# Patient Record
Sex: Male | Born: 1935 | Race: Black or African American | Hispanic: No | Marital: Married | Smoking: Former smoker
Health system: Southern US, Community
[De-identification: ages and names within clinical notes are randomized; demographics above are authoritative.]

## PROBLEM LIST (undated history)

## (undated) DIAGNOSIS — I669 Occlusion and stenosis of unspecified cerebral artery: Secondary | ICD-10-CM

## (undated) DIAGNOSIS — I5022 Chronic systolic (congestive) heart failure: Secondary | ICD-10-CM

## (undated) DIAGNOSIS — M109 Gout, unspecified: Secondary | ICD-10-CM

## (undated) DIAGNOSIS — I480 Paroxysmal atrial fibrillation: Secondary | ICD-10-CM

## (undated) DIAGNOSIS — I639 Cerebral infarction, unspecified: Secondary | ICD-10-CM

## (undated) DIAGNOSIS — E785 Hyperlipidemia, unspecified: Secondary | ICD-10-CM

## (undated) DIAGNOSIS — I2510 Atherosclerotic heart disease of native coronary artery without angina pectoris: Secondary | ICD-10-CM

## (undated) DIAGNOSIS — I10 Essential (primary) hypertension: Secondary | ICD-10-CM

## (undated) HISTORY — PX: CHOLECYSTECTOMY: SHX55

## (undated) HISTORY — PX: CORONARY ARTERY BYPASS GRAFT: SHX141

---

## 1999-07-12 ENCOUNTER — Encounter: Payer: Self-pay | Admitting: Surgery

## 1999-07-12 ENCOUNTER — Inpatient Hospital Stay (HOSPITAL_COMMUNITY): Admission: EM | Admit: 1999-07-12 | Discharge: 1999-07-16 | Payer: Self-pay | Admitting: Emergency Medicine

## 1999-07-12 ENCOUNTER — Encounter: Payer: Self-pay | Admitting: Emergency Medicine

## 1999-07-13 ENCOUNTER — Encounter: Payer: Self-pay | Admitting: Surgery

## 1999-07-17 ENCOUNTER — Encounter (INDEPENDENT_AMBULATORY_CARE_PROVIDER_SITE_OTHER): Payer: Self-pay | Admitting: Specialist

## 1999-07-17 ENCOUNTER — Encounter: Payer: Self-pay | Admitting: Surgery

## 1999-07-17 ENCOUNTER — Observation Stay (HOSPITAL_COMMUNITY): Admission: RE | Admit: 1999-07-17 | Discharge: 1999-07-18 | Payer: Self-pay | Admitting: Surgery

## 2000-06-08 ENCOUNTER — Encounter: Admission: RE | Admit: 2000-06-08 | Discharge: 2000-09-06 | Payer: Self-pay | Admitting: Family Medicine

## 2003-04-05 ENCOUNTER — Emergency Department (HOSPITAL_COMMUNITY): Admission: EM | Admit: 2003-04-05 | Discharge: 2003-04-05 | Payer: Self-pay | Admitting: Emergency Medicine

## 2004-01-19 ENCOUNTER — Inpatient Hospital Stay (HOSPITAL_COMMUNITY): Admission: EM | Admit: 2004-01-19 | Discharge: 2004-01-21 | Payer: Self-pay | Admitting: Emergency Medicine

## 2004-01-20 ENCOUNTER — Encounter (INDEPENDENT_AMBULATORY_CARE_PROVIDER_SITE_OTHER): Payer: Self-pay | Admitting: Interventional Cardiology

## 2004-04-01 ENCOUNTER — Inpatient Hospital Stay (HOSPITAL_COMMUNITY): Admission: EM | Admit: 2004-04-01 | Discharge: 2004-04-04 | Payer: Self-pay | Admitting: Emergency Medicine

## 2005-02-25 ENCOUNTER — Emergency Department (HOSPITAL_COMMUNITY): Admission: EM | Admit: 2005-02-25 | Discharge: 2005-02-25 | Payer: Self-pay | Admitting: Emergency Medicine

## 2007-07-27 ENCOUNTER — Inpatient Hospital Stay (HOSPITAL_COMMUNITY): Admission: EM | Admit: 2007-07-27 | Discharge: 2007-08-04 | Payer: Self-pay | Admitting: Emergency Medicine

## 2007-07-28 ENCOUNTER — Ambulatory Visit: Payer: Self-pay | Admitting: Physical Medicine & Rehabilitation

## 2007-07-28 ENCOUNTER — Encounter (INDEPENDENT_AMBULATORY_CARE_PROVIDER_SITE_OTHER): Payer: Self-pay | Admitting: Interventional Cardiology

## 2007-10-12 ENCOUNTER — Observation Stay (HOSPITAL_COMMUNITY): Admission: EM | Admit: 2007-10-12 | Discharge: 2007-10-13 | Payer: Self-pay | Admitting: Emergency Medicine

## 2008-05-23 ENCOUNTER — Inpatient Hospital Stay (HOSPITAL_COMMUNITY): Admission: EM | Admit: 2008-05-23 | Discharge: 2008-05-28 | Payer: Self-pay | Admitting: Emergency Medicine

## 2008-05-23 ENCOUNTER — Ambulatory Visit: Payer: Self-pay | Admitting: Pulmonary Disease

## 2008-05-23 ENCOUNTER — Encounter: Payer: Self-pay | Admitting: Pulmonary Disease

## 2008-05-23 ENCOUNTER — Ambulatory Visit: Payer: Self-pay | Admitting: Vascular Surgery

## 2008-06-07 ENCOUNTER — Ambulatory Visit: Payer: Self-pay | Admitting: Internal Medicine

## 2008-06-07 ENCOUNTER — Inpatient Hospital Stay (HOSPITAL_COMMUNITY): Admission: EM | Admit: 2008-06-07 | Discharge: 2008-06-13 | Payer: Self-pay | Admitting: Emergency Medicine

## 2008-06-17 ENCOUNTER — Emergency Department (HOSPITAL_COMMUNITY): Admission: EM | Admit: 2008-06-17 | Discharge: 2008-06-18 | Payer: Self-pay | Admitting: Emergency Medicine

## 2008-12-09 ENCOUNTER — Encounter: Admission: RE | Admit: 2008-12-09 | Discharge: 2008-12-09 | Payer: Self-pay | Admitting: Family Medicine

## 2008-12-19 ENCOUNTER — Inpatient Hospital Stay (HOSPITAL_COMMUNITY): Admission: EM | Admit: 2008-12-19 | Discharge: 2008-12-26 | Payer: Self-pay | Admitting: Emergency Medicine

## 2009-01-10 ENCOUNTER — Encounter: Admission: RE | Admit: 2009-01-10 | Discharge: 2009-01-10 | Payer: Self-pay | Admitting: Family Medicine

## 2009-10-14 ENCOUNTER — Emergency Department (HOSPITAL_COMMUNITY): Admission: EM | Admit: 2009-10-14 | Discharge: 2009-10-14 | Payer: Self-pay | Admitting: Emergency Medicine

## 2009-11-15 IMAGING — CR DG CHEST 1V PORT
1 series · 1 of 1 positions shown · non-contrast
Comparison: [DATE]

CLINICAL DATA: Cough

PORTABLE CHEST - 1 VIEW

[AP]
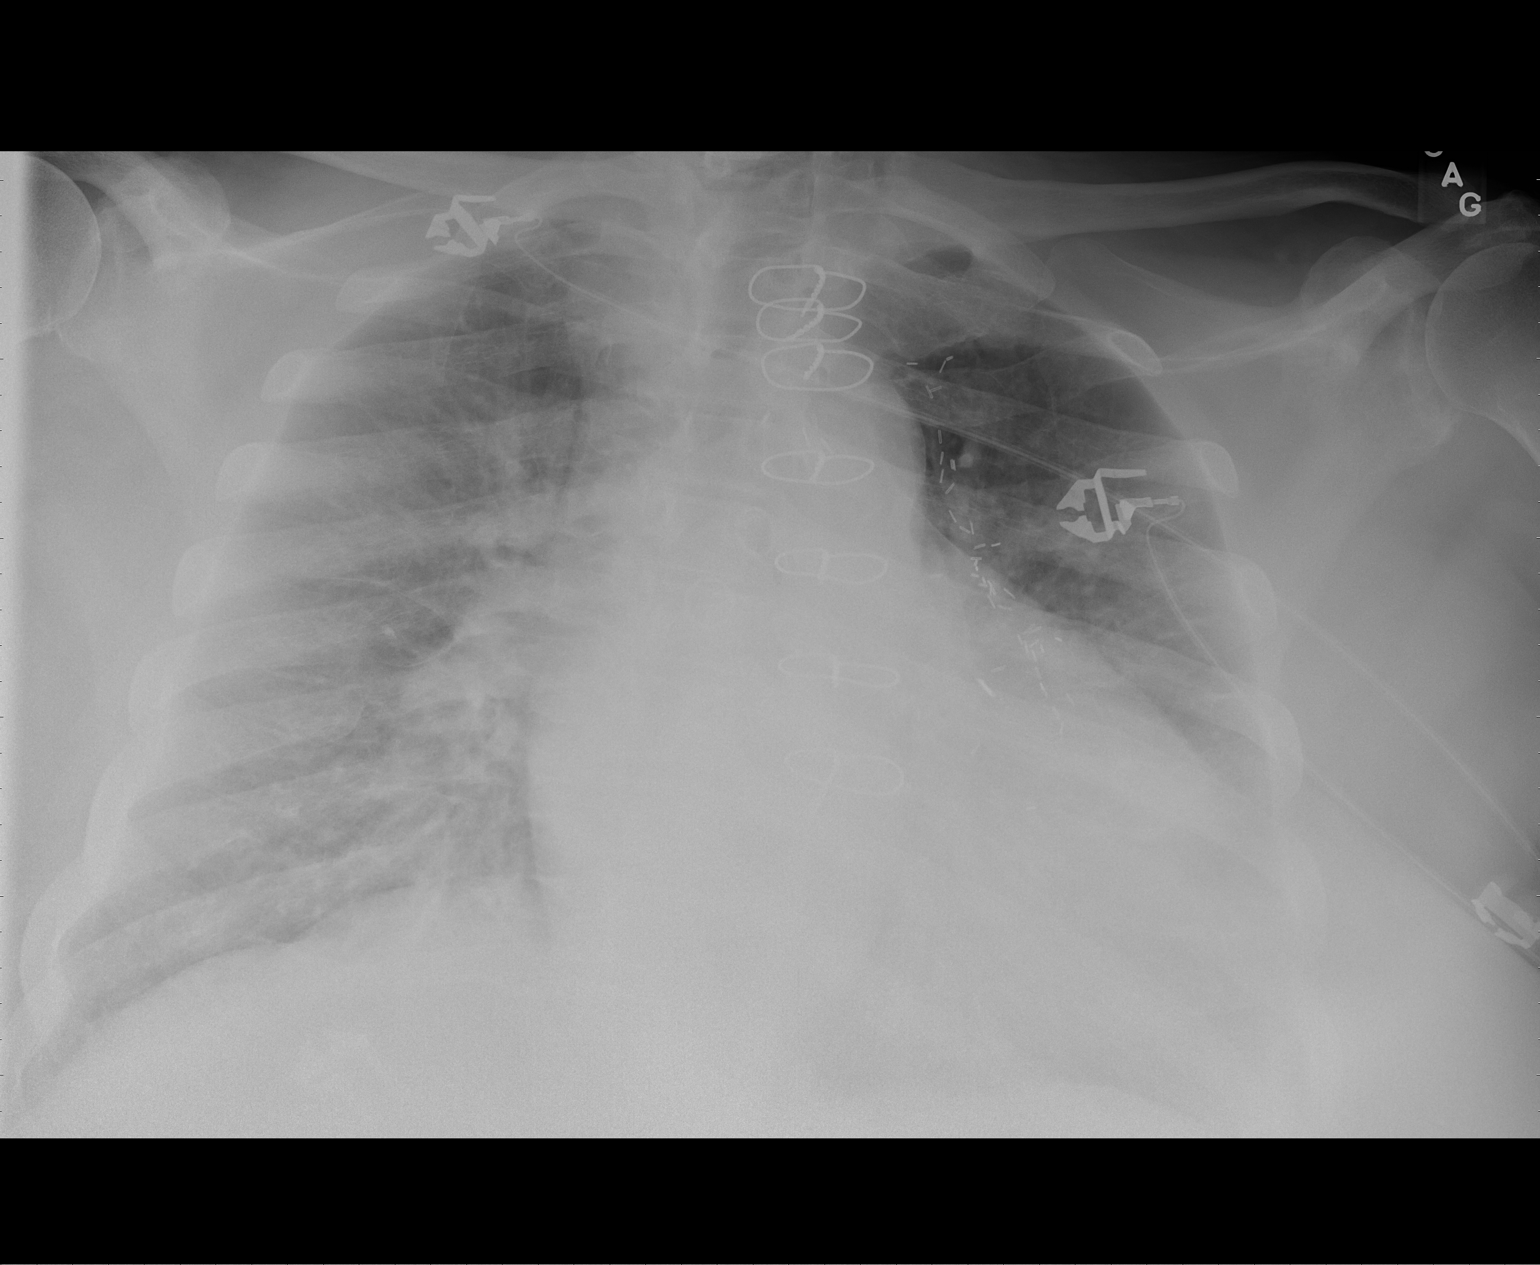

[1 of 1 positions shown; findings below may reference images not displayed]

FINDINGS: There has been previous median sternotomy and CABG.
There is worsening of pulmonary density that could reflect mild
interstitial edema.  No consolidation, collapse or definable
effusion.
IMPRESSION: Increase in interstitial density that could reflect early
interstitial edema.

## 2009-11-19 ENCOUNTER — Emergency Department (HOSPITAL_COMMUNITY): Admission: EM | Admit: 2009-11-19 | Discharge: 2009-11-19 | Payer: Self-pay | Admitting: Emergency Medicine

## 2010-01-04 ENCOUNTER — Emergency Department (HOSPITAL_COMMUNITY): Admission: EM | Admit: 2010-01-04 | Discharge: 2010-01-04 | Payer: Self-pay | Admitting: Emergency Medicine

## 2010-05-12 LAB — DIFFERENTIAL
Basophils Relative: 0 % (ref 0–1)
Lymphocytes Relative: 10 % — ABNORMAL LOW (ref 12–46)
Lymphs Abs: 0.8 10*3/uL (ref 0.7–4.0)
Monocytes Relative: 9 % (ref 3–12)
Neutro Abs: 6.4 10*3/uL (ref 1.7–7.7)
Neutrophils Relative %: 80 % — ABNORMAL HIGH (ref 43–77)

## 2010-05-12 LAB — BASIC METABOLIC PANEL
Calcium: 9.3 mg/dL (ref 8.4–10.5)
GFR calc Af Amer: 57 mL/min — ABNORMAL LOW (ref >60)
GFR calc non Af Amer: 47 mL/min — ABNORMAL LOW (ref >60)
Potassium: 3.8 mEq/L (ref 3.5–5.1)
Sodium: 137 mEq/L (ref 135–145)

## 2010-05-12 LAB — CBC
Hemoglobin: 13.9 g/dL (ref 13.0–17.0)
RBC: 4.84 MIL/uL (ref 4.22–5.81)
WBC: 8 10*3/uL (ref 4.0–10.5)

## 2010-05-14 LAB — CBC
HCT: 41.5 % (ref 39.0–52.0)
Hemoglobin: 13.1 g/dL (ref 13.0–17.0)
Hemoglobin: 12.9 g/dL — ABNORMAL LOW (ref 13.0–17.0)
MCH: 27.8 pg (ref 26.0–34.0)
MCH: 27.6 pg (ref 26.0–34.0)
RBC: 4.72 MIL/uL (ref 4.22–5.81)
RBC: 4.67 MIL/uL (ref 4.22–5.81)

## 2010-05-14 LAB — BASIC METABOLIC PANEL
CO2: 28 mEq/L (ref 19–32)
Calcium: 9 mg/dL (ref 8.4–10.5)
GFR calc Af Amer: 55 mL/min — ABNORMAL LOW (ref >60)
Potassium: 4 mEq/L (ref 3.5–5.1)
Sodium: 137 mEq/L (ref 135–145)

## 2010-05-14 LAB — URIC ACID
Uric Acid, Serum: 6 mg/dL (ref 4.0–7.8)
Uric Acid, Serum: 6.2 mg/dL (ref 4.0–7.8)

## 2010-05-14 LAB — DIFFERENTIAL
Lymphs Abs: 0.5 10*3/uL — ABNORMAL LOW (ref 0.7–4.0)
Monocytes Relative: 8 % (ref 3–12)
Neutro Abs: 7 10*3/uL (ref 1.7–7.7)
Neutrophils Relative %: 85 % — ABNORMAL HIGH (ref 43–77)

## 2010-05-14 LAB — PROTIME-INR: INR: 2.23 — ABNORMAL HIGH (ref 0.00–1.49)

## 2010-05-14 LAB — POCT I-STAT, CHEM 8
Chloride: 108 mEq/L (ref 96–112)
HCT: 44 % (ref 39.0–52.0)
Potassium: 3.9 mEq/L (ref 3.5–5.1)

## 2010-05-14 LAB — BRAIN NATRIURETIC PEPTIDE: Pro B Natriuretic peptide (BNP): 1046 pg/mL — ABNORMAL HIGH (ref 0.0–100.0)

## 2010-06-04 LAB — GLUCOSE, CAPILLARY
Glucose-Capillary: 105 mg/dL — ABNORMAL HIGH (ref 70–99)
Glucose-Capillary: 110 mg/dL — ABNORMAL HIGH (ref 70–99)
Glucose-Capillary: 116 mg/dL — ABNORMAL HIGH (ref 70–99)
Glucose-Capillary: 120 mg/dL — ABNORMAL HIGH (ref 70–99)
Glucose-Capillary: 127 mg/dL — ABNORMAL HIGH (ref 70–99)
Glucose-Capillary: 127 mg/dL — ABNORMAL HIGH (ref 70–99)
Glucose-Capillary: 128 mg/dL — ABNORMAL HIGH (ref 70–99)
Glucose-Capillary: 135 mg/dL — ABNORMAL HIGH (ref 70–99)
Glucose-Capillary: 162 mg/dL — ABNORMAL HIGH (ref 70–99)
Glucose-Capillary: 55 mg/dL — ABNORMAL LOW (ref 70–99)
Glucose-Capillary: 58 mg/dL — ABNORMAL LOW (ref 70–99)
Glucose-Capillary: 61 mg/dL — ABNORMAL LOW (ref 70–99)
Glucose-Capillary: 63 mg/dL — ABNORMAL LOW (ref 70–99)
Glucose-Capillary: 66 mg/dL — ABNORMAL LOW (ref 70–99)
Glucose-Capillary: 72 mg/dL (ref 70–99)
Glucose-Capillary: 73 mg/dL (ref 70–99)
Glucose-Capillary: 75 mg/dL (ref 70–99)
Glucose-Capillary: 78 mg/dL (ref 70–99)
Glucose-Capillary: 82 mg/dL (ref 70–99)
Glucose-Capillary: 85 mg/dL (ref 70–99)
Glucose-Capillary: 87 mg/dL (ref 70–99)
Glucose-Capillary: 89 mg/dL (ref 70–99)
Glucose-Capillary: 89 mg/dL (ref 70–99)
Glucose-Capillary: 95 mg/dL (ref 70–99)
Glucose-Capillary: 98 mg/dL (ref 70–99)

## 2010-06-04 LAB — BASIC METABOLIC PANEL
BUN: 27 mg/dL — ABNORMAL HIGH (ref 6–23)
BUN: 50 mg/dL — ABNORMAL HIGH (ref 6–23)
BUN: 60 mg/dL — ABNORMAL HIGH (ref 6–23)
BUN: 66 mg/dL — ABNORMAL HIGH (ref 6–23)
CO2: 19 mEq/L (ref 19–32)
CO2: 21 mEq/L (ref 19–32)
CO2: 24 mEq/L (ref 19–32)
CO2: 25 mEq/L (ref 19–32)
Calcium: 8.6 mg/dL (ref 8.4–10.5)
Calcium: 9 mg/dL (ref 8.4–10.5)
Calcium: 9.1 mg/dL (ref 8.4–10.5)
Calcium: 9.1 mg/dL (ref 8.4–10.5)
Chloride: 100 mEq/L (ref 96–112)
Chloride: 104 mEq/L (ref 96–112)
Chloride: 105 mEq/L (ref 96–112)
Creatinine, Ser: 1.75 mg/dL — ABNORMAL HIGH (ref 0.4–1.5)
Creatinine, Ser: 1.85 mg/dL — ABNORMAL HIGH (ref 0.4–1.5)
Creatinine, Ser: 1.91 mg/dL — ABNORMAL HIGH (ref 0.4–1.5)
Creatinine, Ser: 2.07 mg/dL — ABNORMAL HIGH (ref 0.4–1.5)
Creatinine, Ser: 2.67 mg/dL — ABNORMAL HIGH (ref 0.4–1.5)
Creatinine, Ser: 2.83 mg/dL — ABNORMAL HIGH (ref 0.4–1.5)
GFR calc Af Amer: 28 mL/min — ABNORMAL LOW (ref >60)
GFR calc Af Amer: 29 mL/min — ABNORMAL LOW (ref >60)
GFR calc Af Amer: 33 mL/min — ABNORMAL LOW (ref >60)
GFR calc Af Amer: 42 mL/min — ABNORMAL LOW (ref >60)
GFR calc non Af Amer: 23 mL/min — ABNORMAL LOW (ref >60)
GFR calc non Af Amer: 24 mL/min — ABNORMAL LOW (ref >60)
GFR calc non Af Amer: 28 mL/min — ABNORMAL LOW (ref >60)
GFR calc non Af Amer: 35 mL/min — ABNORMAL LOW (ref >60)
GFR calc non Af Amer: 36 mL/min — ABNORMAL LOW (ref >60)
GFR calc non Af Amer: 39 mL/min — ABNORMAL LOW (ref >60)
Glucose, Bld: 110 mg/dL — ABNORMAL HIGH (ref 70–99)
Glucose, Bld: 66 mg/dL — ABNORMAL LOW (ref 70–99)
Glucose, Bld: 77 mg/dL (ref 70–99)
Glucose, Bld: 94 mg/dL (ref 70–99)
Potassium: 3.9 mEq/L (ref 3.5–5.1)
Potassium: 4.5 mEq/L (ref 3.5–5.1)
Potassium: 4.7 mEq/L (ref 3.5–5.1)
Potassium: 5.9 mEq/L — ABNORMAL HIGH (ref 3.5–5.1)
Sodium: 133 mEq/L — ABNORMAL LOW (ref 135–145)
Sodium: 134 mEq/L — ABNORMAL LOW (ref 135–145)
Sodium: 135 mEq/L (ref 135–145)
Sodium: 136 mEq/L (ref 135–145)

## 2010-06-04 LAB — CBC
HCT: 37.2 % — ABNORMAL LOW (ref 39.0–52.0)
HCT: 38.5 % — ABNORMAL LOW (ref 39.0–52.0)
HCT: 39.7 % (ref 39.0–52.0)
HCT: 42.4 % (ref 39.0–52.0)
Hemoglobin: 12.4 g/dL — ABNORMAL LOW (ref 13.0–17.0)
Hemoglobin: 13 g/dL (ref 13.0–17.0)
MCHC: 32.6 g/dL (ref 30.0–36.0)
MCHC: 32.8 g/dL (ref 30.0–36.0)
MCHC: 32.9 g/dL (ref 30.0–36.0)
MCV: 88.9 fL (ref 78.0–100.0)
MCV: 89.1 fL (ref 78.0–100.0)
Platelets: 151 10*3/uL (ref 150–400)
Platelets: 152 10*3/uL (ref 150–400)
RBC: 4.49 MIL/uL (ref 4.22–5.81)
RBC: 4.77 MIL/uL (ref 4.22–5.81)
RDW: 20.1 % — ABNORMAL HIGH (ref 11.5–15.5)
WBC: 6.3 10*3/uL (ref 4.0–10.5)
WBC: 6.5 10*3/uL (ref 4.0–10.5)
WBC: 8.3 10*3/uL (ref 4.0–10.5)

## 2010-06-04 LAB — POCT CARDIAC MARKERS
CKMB, poc: 1.5 ng/mL (ref 1.0–8.0)
Troponin i, poc: <0.05 ng/mL (ref 0.00–0.09)

## 2010-06-04 LAB — POCT I-STAT, CHEM 8
BUN: 33 mg/dL — ABNORMAL HIGH (ref 6–23)
Calcium, Ion: 1.21 mmol/L (ref 1.12–1.32)
Chloride: 106 mEq/L (ref 96–112)
Glucose, Bld: 147 mg/dL — ABNORMAL HIGH (ref 70–99)

## 2010-06-04 LAB — DIFFERENTIAL
Eosinophils Relative: 1 % (ref 0–5)
Lymphocytes Relative: 5 % — ABNORMAL LOW (ref 12–46)
Lymphs Abs: 0.3 10*3/uL — ABNORMAL LOW (ref 0.7–4.0)
Monocytes Absolute: 0.3 10*3/uL (ref 0.1–1.0)

## 2010-06-04 LAB — PROTIME-INR
INR: 2.74 — ABNORMAL HIGH (ref 0.00–1.49)
INR: 2.84 — ABNORMAL HIGH (ref 0.00–1.49)
INR: 3.58 — ABNORMAL HIGH (ref 0.00–1.49)
Prothrombin Time: 28.8 seconds — ABNORMAL HIGH (ref 11.6–15.2)
Prothrombin Time: 29.1 seconds — ABNORMAL HIGH (ref 11.6–15.2)
Prothrombin Time: 32.8 seconds — ABNORMAL HIGH (ref 11.6–15.2)
Prothrombin Time: 34.7 seconds — ABNORMAL HIGH (ref 11.6–15.2)
Prothrombin Time: 35.5 seconds — ABNORMAL HIGH (ref 11.6–15.2)
Prothrombin Time: 38.2 seconds — ABNORMAL HIGH (ref 11.6–15.2)

## 2010-06-04 LAB — URINALYSIS, ROUTINE W REFLEX MICROSCOPIC
Bilirubin Urine: NEGATIVE
Glucose, UA: NEGATIVE mg/dL
Hgb urine dipstick: NEGATIVE
Ketones, ur: NEGATIVE mg/dL
Protein, ur: NEGATIVE mg/dL

## 2010-06-04 LAB — HEMOGLOBIN A1C: Mean Plasma Glucose: 126 mg/dL

## 2010-06-04 LAB — BRAIN NATRIURETIC PEPTIDE
Pro B Natriuretic peptide (BNP): 1205 pg/mL — ABNORMAL HIGH (ref 0.0–100.0)
Pro B Natriuretic peptide (BNP): 907 pg/mL — ABNORMAL HIGH (ref 0.0–100.0)

## 2010-06-10 LAB — URINALYSIS, ROUTINE W REFLEX MICROSCOPIC
Bilirubin Urine: NEGATIVE
Glucose, UA: NEGATIVE mg/dL
Hgb urine dipstick: NEGATIVE
Ketones, ur: 15 mg/dL — AB
Nitrite: NEGATIVE
Specific Gravity, Urine: 1.008 (ref 1.005–1.030)
Specific Gravity, Urine: 1.036 — ABNORMAL HIGH (ref 1.005–1.030)
Urobilinogen, UA: 1 mg/dL (ref 0.0–1.0)
pH: 5 (ref 5.0–8.0)
pH: 7.5 (ref 5.0–8.0)

## 2010-06-10 LAB — BASIC METABOLIC PANEL
BUN: 14 mg/dL (ref 6–23)
BUN: 16 mg/dL (ref 6–23)
BUN: 18 mg/dL (ref 6–23)
BUN: 19 mg/dL (ref 6–23)
BUN: 8 mg/dL (ref 6–23)
CO2: 16 mEq/L — ABNORMAL LOW (ref 19–32)
CO2: 24 mEq/L (ref 19–32)
CO2: 27 mEq/L (ref 19–32)
Calcium: 8.6 mg/dL (ref 8.4–10.5)
Calcium: 9 mg/dL (ref 8.4–10.5)
Chloride: 104 mEq/L (ref 96–112)
Chloride: 106 mEq/L (ref 96–112)
Chloride: 108 mEq/L (ref 96–112)
Creatinine, Ser: 1.34 mg/dL (ref 0.4–1.5)
Creatinine, Ser: 1.78 mg/dL — ABNORMAL HIGH (ref 0.4–1.5)
GFR calc Af Amer: 46 mL/min — ABNORMAL LOW (ref >60)
GFR calc Af Amer: >60 mL/min (ref >60)
GFR calc Af Amer: >60 mL/min (ref >60)
GFR calc non Af Amer: 41 mL/min — ABNORMAL LOW (ref >60)
GFR calc non Af Amer: 50 mL/min — ABNORMAL LOW (ref >60)
GFR calc non Af Amer: 53 mL/min — ABNORMAL LOW (ref >60)
GFR calc non Af Amer: 56 mL/min — ABNORMAL LOW (ref >60)
Glucose, Bld: 334 mg/dL — ABNORMAL HIGH (ref 70–99)
Glucose, Bld: 91 mg/dL (ref 70–99)
Potassium: 3.9 mEq/L (ref 3.5–5.1)
Potassium: 4.5 mEq/L (ref 3.5–5.1)
Potassium: 5.3 mEq/L — ABNORMAL HIGH (ref 3.5–5.1)
Sodium: 129 mEq/L — ABNORMAL LOW (ref 135–145)
Sodium: 135 mEq/L (ref 135–145)
Sodium: 139 mEq/L (ref 135–145)

## 2010-06-10 LAB — DIFFERENTIAL
Basophils Absolute: 0 10*3/uL (ref 0.0–0.1)
Basophils Absolute: 0 10*3/uL (ref 0.0–0.1)
Eosinophils Absolute: 0.1 10*3/uL (ref 0.0–0.7)
Eosinophils Absolute: 0.3 10*3/uL (ref 0.0–0.7)
Eosinophils Relative: 1 % (ref 0–5)
Eosinophils Relative: 4 % (ref 0–5)
Lymphocytes Relative: 12 % (ref 12–46)
Lymphocytes Relative: 9 % — ABNORMAL LOW (ref 12–46)
Lymphocytes Relative: 9 % — ABNORMAL LOW (ref 12–46)
Lymphs Abs: 0.8 10*3/uL (ref 0.7–4.0)
Monocytes Absolute: 0.2 10*3/uL (ref 0.1–1.0)
Monocytes Absolute: 0.7 10*3/uL (ref 0.1–1.0)
Monocytes Relative: 9 % (ref 3–12)
Neutro Abs: 4.6 10*3/uL (ref 1.7–7.7)
Neutrophils Relative %: 74 % (ref 43–77)

## 2010-06-10 LAB — GLUCOSE, CAPILLARY
Glucose-Capillary: 100 mg/dL — ABNORMAL HIGH (ref 70–99)
Glucose-Capillary: 108 mg/dL — ABNORMAL HIGH (ref 70–99)
Glucose-Capillary: 113 mg/dL — ABNORMAL HIGH (ref 70–99)
Glucose-Capillary: 119 mg/dL — ABNORMAL HIGH (ref 70–99)
Glucose-Capillary: 128 mg/dL — ABNORMAL HIGH (ref 70–99)
Glucose-Capillary: 131 mg/dL — ABNORMAL HIGH (ref 70–99)
Glucose-Capillary: 136 mg/dL — ABNORMAL HIGH (ref 70–99)
Glucose-Capillary: 137 mg/dL — ABNORMAL HIGH (ref 70–99)
Glucose-Capillary: 169 mg/dL — ABNORMAL HIGH (ref 70–99)
Glucose-Capillary: 60 mg/dL — ABNORMAL LOW (ref 70–99)
Glucose-Capillary: 62 mg/dL — ABNORMAL LOW (ref 70–99)
Glucose-Capillary: 68 mg/dL — ABNORMAL LOW (ref 70–99)
Glucose-Capillary: 71 mg/dL (ref 70–99)
Glucose-Capillary: 78 mg/dL (ref 70–99)
Glucose-Capillary: 81 mg/dL (ref 70–99)
Glucose-Capillary: 89 mg/dL (ref 70–99)
Glucose-Capillary: 91 mg/dL (ref 70–99)

## 2010-06-10 LAB — STREP PNEUMONIAE URINARY ANTIGEN: Strep Pneumo Urinary Antigen: NEGATIVE

## 2010-06-10 LAB — CBC
HCT: 35.2 % — ABNORMAL LOW (ref 39.0–52.0)
HCT: 36.8 % — ABNORMAL LOW (ref 39.0–52.0)
HCT: 39.9 % (ref 39.0–52.0)
HCT: 40 % (ref 39.0–52.0)
Hemoglobin: 11.5 g/dL — ABNORMAL LOW (ref 13.0–17.0)
Hemoglobin: 11.6 g/dL — ABNORMAL LOW (ref 13.0–17.0)
Hemoglobin: 12.2 g/dL — ABNORMAL LOW (ref 13.0–17.0)
Hemoglobin: 13 g/dL (ref 13.0–17.0)
Hemoglobin: 13.4 g/dL (ref 13.0–17.0)
MCHC: 32.5 g/dL (ref 30.0–36.0)
MCHC: 33.2 g/dL (ref 30.0–36.0)
MCHC: 33.3 g/dL (ref 30.0–36.0)
MCHC: 33.5 g/dL (ref 30.0–36.0)
MCV: 86.9 fL (ref 78.0–100.0)
MCV: 87 fL (ref 78.0–100.0)
MCV: 87.1 fL (ref 78.0–100.0)
MCV: 87.4 fL (ref 78.0–100.0)
MCV: 88.3 fL (ref 78.0–100.0)
MCV: 88.5 fL (ref 78.0–100.0)
Platelets: 171 10*3/uL (ref 150–400)
Platelets: 178 10*3/uL (ref 150–400)
Platelets: 185 10*3/uL (ref 150–400)
Platelets: 199 10*3/uL (ref 150–400)
RBC: 3.99 MIL/uL — ABNORMAL LOW (ref 4.22–5.81)
RBC: 4.37 MIL/uL (ref 4.22–5.81)
RBC: 4.62 MIL/uL (ref 4.22–5.81)
RDW: 20.3 % — ABNORMAL HIGH (ref 11.5–15.5)
RDW: 20.5 % — ABNORMAL HIGH (ref 11.5–15.5)
RDW: 20.8 % — ABNORMAL HIGH (ref 11.5–15.5)
WBC: 6.1 10*3/uL (ref 4.0–10.5)
WBC: 6.2 10*3/uL (ref 4.0–10.5)
WBC: 6.4 10*3/uL (ref 4.0–10.5)
WBC: 8.9 10*3/uL (ref 4.0–10.5)

## 2010-06-10 LAB — EXPECTORATED SPUTUM ASSESSMENT W GRAM STAIN, RFLX TO RESP C

## 2010-06-10 LAB — POCT I-STAT 3, ART BLOOD GAS (G3+)
Acid-base deficit: 10 mmol/L — ABNORMAL HIGH (ref 0.0–2.0)
Bicarbonate: 15.6 mEq/L — ABNORMAL LOW (ref 20.0–24.0)
O2 Saturation: 98 %
O2 Saturation: 99 %
TCO2: 14 mmol/L (ref 0–100)
pCO2 arterial: 25.8 mmHg — ABNORMAL LOW (ref 35.0–45.0)
pO2, Arterial: 109 mmHg — ABNORMAL HIGH (ref 80.0–100.0)

## 2010-06-10 LAB — POTASSIUM: Potassium: 4.6 mEq/L (ref 3.5–5.1)

## 2010-06-10 LAB — URINE CULTURE
Colony Count: NO GROWTH
Culture: NO GROWTH
Culture: NO GROWTH
Special Requests: NEGATIVE

## 2010-06-10 LAB — PROTIME-INR
INR: 1.9 — ABNORMAL HIGH (ref 0.00–1.49)
INR: 3.4 — ABNORMAL HIGH (ref 0.00–1.49)
INR: 4 — ABNORMAL HIGH (ref 0.00–1.49)
INR: 5.1 (ref 0.00–1.49)
Prothrombin Time: 22.6 seconds — ABNORMAL HIGH (ref 11.6–15.2)
Prothrombin Time: 27.5 seconds — ABNORMAL HIGH (ref 11.6–15.2)
Prothrombin Time: 32.7 seconds — ABNORMAL HIGH (ref 11.6–15.2)
Prothrombin Time: 42.1 seconds — ABNORMAL HIGH (ref 11.6–15.2)

## 2010-06-10 LAB — LACTIC ACID, PLASMA: Lactic Acid, Venous: <0.3 mmol/L — ABNORMAL LOW (ref 0.5–2.2)

## 2010-06-10 LAB — CK TOTAL AND CKMB (NOT AT ARMC)
Relative Index: 2.3 (ref 0.0–2.5)
Total CK: 112 U/L (ref 7–232)

## 2010-06-10 LAB — APTT
aPTT: 42 seconds — ABNORMAL HIGH (ref 24–37)
aPTT: 52 seconds — ABNORMAL HIGH (ref 24–37)
aPTT: 63 seconds — ABNORMAL HIGH (ref 24–37)
aPTT: 71 seconds — ABNORMAL HIGH (ref 24–37)

## 2010-06-10 LAB — PHOSPHORUS
Phosphorus: 2.5 mg/dL (ref 2.3–4.6)
Phosphorus: 3.4 mg/dL (ref 2.3–4.6)

## 2010-06-10 LAB — URINE MICROSCOPIC-ADD ON

## 2010-06-10 LAB — MAGNESIUM
Magnesium: 2.2 mg/dL (ref 1.5–2.5)
Magnesium: 2.4 mg/dL (ref 1.5–2.5)

## 2010-06-10 LAB — LEGIONELLA ANTIGEN, URINE

## 2010-06-10 LAB — POCT CARDIAC MARKERS
CKMB, poc: 1.4 ng/mL (ref 1.0–8.0)
Myoglobin, poc: 192 ng/mL (ref 12–200)

## 2010-06-10 LAB — CULTURE, BLOOD (ROUTINE X 2): Culture: NO GROWTH

## 2010-06-10 LAB — CULTURE, RESPIRATORY W GRAM STAIN

## 2010-06-10 LAB — BRAIN NATRIURETIC PEPTIDE
Pro B Natriuretic peptide (BNP): 1367 pg/mL — ABNORMAL HIGH (ref 0.0–100.0)
Pro B Natriuretic peptide (BNP): 1475 pg/mL — ABNORMAL HIGH (ref 0.0–100.0)

## 2010-06-11 LAB — GLUCOSE, CAPILLARY
Glucose-Capillary: 114 mg/dL — ABNORMAL HIGH (ref 70–99)
Glucose-Capillary: 120 mg/dL — ABNORMAL HIGH (ref 70–99)
Glucose-Capillary: 122 mg/dL — ABNORMAL HIGH (ref 70–99)
Glucose-Capillary: 124 mg/dL — ABNORMAL HIGH (ref 70–99)
Glucose-Capillary: 147 mg/dL — ABNORMAL HIGH (ref 70–99)
Glucose-Capillary: 147 mg/dL — ABNORMAL HIGH (ref 70–99)
Glucose-Capillary: 150 mg/dL — ABNORMAL HIGH (ref 70–99)
Glucose-Capillary: 151 mg/dL — ABNORMAL HIGH (ref 70–99)
Glucose-Capillary: 157 mg/dL — ABNORMAL HIGH (ref 70–99)
Glucose-Capillary: 174 mg/dL — ABNORMAL HIGH (ref 70–99)
Glucose-Capillary: 178 mg/dL — ABNORMAL HIGH (ref 70–99)
Glucose-Capillary: 180 mg/dL — ABNORMAL HIGH (ref 70–99)
Glucose-Capillary: 192 mg/dL — ABNORMAL HIGH (ref 70–99)
Glucose-Capillary: 194 mg/dL — ABNORMAL HIGH (ref 70–99)
Glucose-Capillary: 194 mg/dL — ABNORMAL HIGH (ref 70–99)
Glucose-Capillary: 68 mg/dL — ABNORMAL LOW (ref 70–99)
Glucose-Capillary: 71 mg/dL (ref 70–99)
Glucose-Capillary: 74 mg/dL (ref 70–99)
Glucose-Capillary: 74 mg/dL (ref 70–99)
Glucose-Capillary: 76 mg/dL (ref 70–99)
Glucose-Capillary: 77 mg/dL (ref 70–99)
Glucose-Capillary: 84 mg/dL (ref 70–99)
Glucose-Capillary: 84 mg/dL (ref 70–99)
Glucose-Capillary: 86 mg/dL (ref 70–99)
Glucose-Capillary: 89 mg/dL (ref 70–99)
Glucose-Capillary: 90 mg/dL (ref 70–99)
Glucose-Capillary: 97 mg/dL (ref 70–99)
Glucose-Capillary: 99 mg/dL (ref 70–99)

## 2010-06-11 LAB — CBC
HCT: 36.7 % — ABNORMAL LOW (ref 39.0–52.0)
HCT: 37.3 % — ABNORMAL LOW (ref 39.0–52.0)
HCT: 40 % (ref 39.0–52.0)
HCT: 40.3 % (ref 39.0–52.0)
Hemoglobin: 12 g/dL — ABNORMAL LOW (ref 13.0–17.0)
Hemoglobin: 12.1 g/dL — ABNORMAL LOW (ref 13.0–17.0)
MCHC: 32.4 g/dL (ref 30.0–36.0)
MCHC: 32.5 g/dL (ref 30.0–36.0)
MCHC: 32.7 g/dL (ref 30.0–36.0)
MCHC: 32.8 g/dL (ref 30.0–36.0)
MCV: 86.5 fL (ref 78.0–100.0)
MCV: 86.9 fL (ref 78.0–100.0)
MCV: 86.9 fL (ref 78.0–100.0)
MCV: 87.4 fL (ref 78.0–100.0)
MCV: 87.6 fL (ref 78.0–100.0)
MCV: 87.6 fL (ref 78.0–100.0)
Platelets: 121 10*3/uL — ABNORMAL LOW (ref 150–400)
Platelets: 122 10*3/uL — ABNORMAL LOW (ref 150–400)
Platelets: 125 10*3/uL — ABNORMAL LOW (ref 150–400)
Platelets: 158 10*3/uL (ref 150–400)
RBC: 4.22 MIL/uL (ref 4.22–5.81)
RBC: 4.26 MIL/uL (ref 4.22–5.81)
RBC: 4.43 MIL/uL (ref 4.22–5.81)
RBC: 4.63 MIL/uL (ref 4.22–5.81)
RDW: 19.7 % — ABNORMAL HIGH (ref 11.5–15.5)
RDW: 19.8 % — ABNORMAL HIGH (ref 11.5–15.5)
WBC: 4 10*3/uL (ref 4.0–10.5)
WBC: 4.8 10*3/uL (ref 4.0–10.5)
WBC: 6 10*3/uL (ref 4.0–10.5)
WBC: 6.9 10*3/uL (ref 4.0–10.5)
WBC: 6.9 10*3/uL (ref 4.0–10.5)

## 2010-06-11 LAB — CULTURE, BLOOD (ROUTINE X 2)

## 2010-06-11 LAB — PROTIME-INR
INR: 1.8 — ABNORMAL HIGH (ref 0.00–1.49)
INR: 2.6 — ABNORMAL HIGH (ref 0.00–1.49)
INR: 2.8 — ABNORMAL HIGH (ref 0.00–1.49)
Prothrombin Time: 21 seconds — ABNORMAL HIGH (ref 11.6–15.2)
Prothrombin Time: 21.8 seconds — ABNORMAL HIGH (ref 11.6–15.2)
Prothrombin Time: 24.5 seconds — ABNORMAL HIGH (ref 11.6–15.2)
Prothrombin Time: 29.4 seconds — ABNORMAL HIGH (ref 11.6–15.2)
Prothrombin Time: 31.5 seconds — ABNORMAL HIGH (ref 11.6–15.2)

## 2010-06-11 LAB — BLOOD GAS, ARTERIAL
Bicarbonate: 20.6 mEq/L (ref 20.0–24.0)
Drawn by: 103701
O2 Content: 2 L/min
Patient temperature: 98.3
pH, Arterial: 7.406 (ref 7.350–7.450)

## 2010-06-11 LAB — DIFFERENTIAL
Basophils Absolute: 0 10*3/uL (ref 0.0–0.1)
Basophils Relative: 0 % (ref 0–1)
Basophils Relative: 0 % (ref 0–1)
Basophils Relative: 1 % (ref 0–1)
Eosinophils Absolute: 0 10*3/uL (ref 0.0–0.7)
Eosinophils Absolute: 0 10*3/uL (ref 0.0–0.7)
Eosinophils Absolute: 0 10*3/uL (ref 0.0–0.7)
Eosinophils Relative: 0 % (ref 0–5)
Eosinophils Relative: 0 % (ref 0–5)
Eosinophils Relative: 0 % (ref 0–5)
Lymphocytes Relative: 9 % — ABNORMAL LOW (ref 12–46)
Lymphs Abs: 0.2 10*3/uL — ABNORMAL LOW (ref 0.7–4.0)
Lymphs Abs: 0.5 10*3/uL — ABNORMAL LOW (ref 0.7–4.0)
Monocytes Absolute: 0.1 10*3/uL (ref 0.1–1.0)
Monocytes Absolute: 0.3 10*3/uL (ref 0.1–1.0)
Monocytes Relative: 7 % (ref 3–12)
Monocytes Relative: 8 % (ref 3–12)
Neutro Abs: 3 10*3/uL (ref 1.7–7.7)
Neutrophils Relative %: 88 % — ABNORMAL HIGH (ref 43–77)

## 2010-06-11 LAB — BASIC METABOLIC PANEL
BUN: 15 mg/dL (ref 6–23)
BUN: 18 mg/dL (ref 6–23)
BUN: 24 mg/dL — ABNORMAL HIGH (ref 6–23)
CO2: 20 mEq/L (ref 19–32)
CO2: 23 mEq/L (ref 19–32)
CO2: 23 mEq/L (ref 19–32)
Calcium: 8.5 mg/dL (ref 8.4–10.5)
Calcium: 8.6 mg/dL (ref 8.4–10.5)
Calcium: 8.8 mg/dL (ref 8.4–10.5)
Chloride: 101 mEq/L (ref 96–112)
Chloride: 104 mEq/L (ref 96–112)
Chloride: 106 mEq/L (ref 96–112)
Chloride: 108 mEq/L (ref 96–112)
Creatinine, Ser: 1.34 mg/dL (ref 0.4–1.5)
Creatinine, Ser: 1.38 mg/dL (ref 0.4–1.5)
Creatinine, Ser: 1.47 mg/dL (ref 0.4–1.5)
Creatinine, Ser: 1.49 mg/dL (ref 0.4–1.5)
GFR calc Af Amer: 56 mL/min — ABNORMAL LOW (ref >60)
GFR calc Af Amer: 57 mL/min — ABNORMAL LOW (ref >60)
GFR calc Af Amer: >60 mL/min (ref >60)
GFR calc non Af Amer: 47 mL/min — ABNORMAL LOW (ref >60)
GFR calc non Af Amer: 53 mL/min — ABNORMAL LOW (ref >60)
Glucose, Bld: 82 mg/dL (ref 70–99)
Potassium: 3.8 mEq/L (ref 3.5–5.1)
Potassium: 4.2 mEq/L (ref 3.5–5.1)
Potassium: 4.3 mEq/L (ref 3.5–5.1)
Sodium: 134 mEq/L — ABNORMAL LOW (ref 135–145)
Sodium: 135 mEq/L (ref 135–145)
Sodium: 135 mEq/L (ref 135–145)

## 2010-06-11 LAB — CK TOTAL AND CKMB (NOT AT ARMC)
CK, MB: 2.7 ng/mL (ref 0.3–4.0)
CK, MB: 4.7 ng/mL — ABNORMAL HIGH (ref 0.3–4.0)
Relative Index: 0.3 (ref 0.0–2.5)
Relative Index: 0.5 (ref 0.0–2.5)
Total CK: 783 U/L — ABNORMAL HIGH (ref 7–232)
Total CK: 980 U/L — ABNORMAL HIGH (ref 7–232)

## 2010-06-11 LAB — POCT I-STAT, CHEM 8
BUN: 23 mg/dL (ref 6–23)
Calcium, Ion: 1.01 mmol/L — ABNORMAL LOW (ref 1.12–1.32)
Chloride: 106 mEq/L (ref 96–112)
Creatinine, Ser: 1.7 mg/dL — ABNORMAL HIGH (ref 0.4–1.5)
Glucose, Bld: 67 mg/dL — ABNORMAL LOW (ref 70–99)
HCT: 42 % (ref 39.0–52.0)
Hemoglobin: 14.3 g/dL (ref 13.0–17.0)
Potassium: 4.2 meq/L (ref 3.5–5.1)
Sodium: 136 meq/L (ref 135–145)
TCO2: 21 mmol/L (ref 0–100)

## 2010-06-11 LAB — URINE CULTURE
Colony Count: NO GROWTH
Culture: NO GROWTH

## 2010-06-11 LAB — CARDIAC PANEL(CRET KIN+CKTOT+MB+TROPI)
CK, MB: 2.9 ng/mL (ref 0.3–4.0)
Total CK: 622 U/L — ABNORMAL HIGH (ref 7–232)
Total CK: 746 U/L — ABNORMAL HIGH (ref 7–232)
Troponin I: 0.09 ng/mL — ABNORMAL HIGH (ref 0.00–0.06)

## 2010-06-11 LAB — COMPREHENSIVE METABOLIC PANEL
AST: 41 U/L — ABNORMAL HIGH (ref 0–37)
Albumin: 3.1 g/dL — ABNORMAL LOW (ref 3.5–5.2)
Calcium: 8 mg/dL — ABNORMAL LOW (ref 8.4–10.5)
Creatinine, Ser: 1.72 mg/dL — ABNORMAL HIGH (ref 0.4–1.5)
GFR calc Af Amer: 48 mL/min — ABNORMAL LOW (ref >60)
Total Protein: 6.5 g/dL (ref 6.0–8.3)

## 2010-06-11 LAB — TROPONIN I
Troponin I: 0.03 ng/mL (ref 0.00–0.06)
Troponin I: 0.03 ng/mL (ref 0.00–0.06)

## 2010-06-11 LAB — URINALYSIS, ROUTINE W REFLEX MICROSCOPIC
Glucose, UA: NEGATIVE mg/dL
Hgb urine dipstick: NEGATIVE
Ketones, ur: NEGATIVE mg/dL
Nitrite: NEGATIVE
Protein, ur: NEGATIVE mg/dL
Specific Gravity, Urine: 1.021 (ref 1.005–1.030)
Urobilinogen, UA: 4 mg/dL — ABNORMAL HIGH (ref 0.0–1.0)
pH: 5.5 (ref 5.0–8.0)

## 2010-06-11 LAB — LEGIONELLA ANTIGEN, URINE: Legionella Antigen, Urine: NEGATIVE

## 2010-06-11 LAB — BRAIN NATRIURETIC PEPTIDE
Pro B Natriuretic peptide (BNP): 1100 pg/mL — ABNORMAL HIGH (ref 0.0–100.0)
Pro B Natriuretic peptide (BNP): 573 pg/mL — ABNORMAL HIGH (ref 0.0–100.0)
Pro B Natriuretic peptide (BNP): 886 pg/mL — ABNORMAL HIGH (ref 0.0–100.0)
Pro B Natriuretic peptide (BNP): 893 pg/mL — ABNORMAL HIGH (ref 0.0–100.0)

## 2010-06-11 LAB — SODIUM, URINE, RANDOM: Sodium, Ur: 28 mEq/L

## 2010-06-11 LAB — TSH: TSH: 2.947 u[IU]/mL (ref 0.350–4.500)

## 2010-06-11 LAB — LIPID PANEL
Cholesterol: 68 mg/dL (ref 0–200)
Total CHOL/HDL Ratio: 3.2 RATIO

## 2010-06-11 LAB — H1N1 SCREEN (PCR)

## 2010-07-14 NOTE — Discharge Summary (Signed)
[name], FRITCHMAN NO.:  [id number]   MEDICAL RECORD NO.:  [id number]          PATIENT TYPE:  INP   LOCATION:  2002                         FACILITY:  [facility]   PHYSICIAN:  Lyn Records, M.D.   DATE OF BIRTH:  1934-11-14   DATE OF ADMISSION:  07/27/2007  DATE OF DISCHARGE:  08/04/2007                               DISCHARGE SUMMARY   ADDENDUM   The patient will be sent home on Nitro-Dur patch 0.4 mg per hour.  In  the hospital, he has been having this applied at bedtime and removing it  at 6 p.m.      Guy Franco, P.A.      Lyn Records, M.D.  Electronically Signed    LB/MEDQ  D:  08/04/2007  T:  08/04/2007  Job:  [id number]

## 2010-07-14 NOTE — Discharge Summary (Signed)
[name], [name] NO.:  [id number]   MEDICAL RECORD NO.:  [id number]          PATIENT TYPE:  INP   LOCATION:  2002                         FACILITY:  [facility]   PHYSICIAN:  [name], M.D.   DATE OF BIRTH:  1934-03-16   DATE OF ADMISSION:  07/27/2007  DATE OF DISCHARGE:  08/04/2007                               DISCHARGE SUMMARY   DISCHARGE DIAGNOSES:  1. Coronary artery disease, status post non-ST segment elevated      mycoardial infarction secondary to suspected bypass graft      occlusion.  2. Congestive heart failure, resolved, but felt secondary to left      ventricular dysfunction.  3. Paroxysmal atrial fibrillation, now in normal sinus rhythm.  4. Long-term Coumadin use with therapeutic INR.  5. Embolic cerebrovascular accident, treated with tissue plasminogen      activator.  6. Diabetes mellitus.  7. Hypertension.  8. Hyperlipidemia.  9. Obesity.  10.Long-term medication use.  11.Long-term Coumadin use.   HOSPITAL COURSE:  [name] is a 76 year old male patient with a known  history of coronary artery disease.  He has had bypass surgery in the  past.  He has a known ischemic cardiomyopathy with an EF of about 30% by  echo about 3 years ago.  He presented to the hospital with several days  of worsening shortness of breath and chest pressure.  In the emergency  room, chest x-ray showed evidence of acute CHF.   LABORATORY DATA:  We did rule him for non-ST segment elevated myocardial  infarction.  From a cardiac standpoint, he was treated medically with  oral medication therapy.   Main reason for medical treatment at this time is since the patient  underwent a code stroke in the early morning of Jul 28, 2006.  At 2  a.m., the patient was normal and about 15 minutes later, the son called  the nurse and stated that his father had developed dysarthria, slurred  speech, and left-sided weakness.  The nurse noticed a left-sided fascial  droop  and significant drift in the left arm.  A CT was obtained by 2:25  a.m. that showed no acute stroke but did show an old cerebellar stroke.  Despite the above findings, the patient was suspected to have a CVA that  was right hemispheric with resultant left-sided weakness, facial droop,  and dysarthria.  Initially, the decision was made to give IV tPA.  Ultimately, the patient was placed on Coumadin for various reasons  including paroxysmal atrial fibrillation.   The patient worked with the stroke team during this hospitalization as  well as occupational and physical therapy.  On August 04, 2007, the patient  was felt to be ready for discharge to home.   Lab study during his hospital stay included a 2D echo that showed the  left ventricle with moderately-to-markedly dilated with an EF of about  25% with diffuse LV hypokinesis.  Estimated peak pulmonary artery  systolic pressure was mildly increased at 36 mmHg.   Otherwise, studies at discharge included protime of 25.9, INR of 2.3.  CBC showed hemoglobin 12.3, hematocrit 36.5, white count 7.0, platelets  204, sodium 135, potassium 3.6, BUN 22, and creatinine 1.62.   DISCHARGE MEDICATIONS:  1. Hyoscyamine twice a day as needed.  2. Metoprolol 25 mg p.o. b.i.d.  3. Glimepiride 4 mg a day.  4. Tarka 2/240 mg a day.  5. Vytorin 10/40 1 tablet daily.  6. Potassium 20 mEq a day.  7. Lasix 80 mg twice a day.  8. Aspirin 81 mg a day.  9. Coumadin 5 mg a day.  10.Sublingual nitroglycerin p.r.n. chest pain.  11.Allopurinol 300 mg a day.   The patient is to work with occupational therapy and physical therapy at  home.  We will ask the home health nurse to draw a protime and INR in 1  week and have it directed to our office for further evaluation.  The  patient is to remain on low-sodium, heart-healthy, diabetic diet.  The  patient is to follow with Dr. Katrinka Blazing in 2 weeks and appointment time will  be given.      Guy Franco, P.A.       [name], M.D.  Electronically Signed    LB/MEDQ  D:  08/04/2007  T:  08/05/2007  Job:  [id number]   cc:   [name], M.D.

## 2010-07-14 NOTE — Consult Note (Signed)
[name], CAPISTRAN NO.:  [id number]   MEDICAL RECORD NO.:  [id number]          PATIENT TYPE:  INP   LOCATION:  2101                         FACILITY:  [facility]   PHYSICIAN:  Wendi Snipes, MD DATE OF BIRTH:  01-28-1935   DATE OF CONSULTATION:  06/08/2008  DATE OF DISCHARGE:                                 CONSULTATION   PRIMARY CARDIOLOGIST:  Verdis Prime with [facility].   PRIMARY DOCTOR:  Dr. Arvilla Market.   REASON FOR CONSULTATION:  Bradycardia associated with hypotension.   CHIEF COMPLAINT:  Vomiting.   HISTORY OF PRESENT ILLNESS:  This is a 76 year old African American male  with multiple medical problems including ischemic cardiomyopathy with an  EF 25%, COPD and paroxysmal atrial fibrillation who presents to the  emergency department with nausea and vomiting after eating.  The patient  states that he has been doing quite well since recent hospital  admissions for pneumonia when he fell acutely ill this evening.  He had  recently eaten and vomited that was associated with feeling of profound  weakness.  He states that he has never had these symptoms before, and  symptoms were not associated with chest pain, syncope, palpitation,  increased lower extremity edema, paroxysmal nocturnal dyspnea or  orthopnea. His symptoms are mildly improved now since being treated in  the ED.  In the ED, he was found to have junctional bradycardia with  blood pressure of 70 systolic.  The patient was subsequently treated  with IV antibiotics, fluids and phenylephrine.   PAST MEDICAL HISTORY:  1. Ischemic cardiomyopathy, ejection fraction 25%.  2. Coronary artery disease status post coronary artery bypass graft.  3. COPD.  4. Diabetes.  5. Hyperlipidemia.  6. Stroke.  7. Paroxysmal atrial fibrillation on anticoagulation.   ALLERGIES:  No known drug allergies.   MEDICATIONS ON ADMISSION:  1. Allopurinol 300 mg daily.  2. Aspirin 81 mg daily.  3. Glimepiride 4 mg  daily.  4. Hyomax 0.375 mg daily.  5. Potassium chloride 20 mEq daily.  6. Metoprolol tartrate 25 mg twice daily.  7. Vytorin 10/40 mg every night.  8. Tarka 2/240 mg daily.   SOCIAL HISTORY:  He lives in [place] with his wife.  He is retired.  He does not smoke.   FAMILY HISTORY:  His father died of MI, otherwise noncontributory.   REVIEW OF SYSTEMS:  All 14 systems were reviewed and were negative  except as mentioned in detail in HPI.   PHYSICAL EXAMINATION:  His blood pressure is 152/97.  His respiratory  rate is 18.  His pulse is 56.  GENERAL:  He is a 76 year old African American male appearing  chronically ill in no acute distress.  HEENT:  Moist mucous membranes.  Pupils are equal, round and reactive to  light and accommodation.  Anicteric sclera.  NECK:  Jugular venous distention pulsations at 15 cm without  thyromegaly.  CARDIOVASCULAR:  Regular rate and rhythm.  No murmurs, rubs or gallops.  LUNGS:  Coarse breath sounds throughout.  Occasionally end-expiratory  wheezes.  ABDOMEN:  Nontender, nondistended.  Positive bowel  sounds.  No masses.  EXTREMITIES:  2+ pitting edema to the shins.  Cool to the touch.  NEUROLOGIC:  Alert and oriented x3.  Cranial nerves II-XII grossly  intact.  No focal neurologic deficits.  SKIN:  Is cool to touch.  No rashes.  PSYCH:  Mood and affect are appropriate.   RADIOLOGY:  Chest x-ray showed right lower lobe infiltrate concerning  for possible asymmetric edema versus infection.  EKG showed a junctional  rhythm with a rate of 49 with questionable T-waves suggesting A-V  dissociation.   LABORATORY REVIEW:  White blood cell count 6, hematocrit 40, platelets  181.  His sodium is 129, potassium 5.3.  His creatinine is 1.7, blood  sugar is 334.  His BNP is 760.  Initial cardiac enzymes were  unremarkable.  His INR is 3.9.  His blood gas shows a pH of 7.39, pCO2  of 25 and pO2 of 109.   ASSESSMENT AND PLAN:  Assessment is a 76 year old  African American male  with ischemic cardiomyopathy who presents with vomiting and found to be  hypertensive with a junctional bradycardia.   Bradycardia.  EKG suggests possible third-degree AV block versus a sinus  arrest with a junctional escape.  His hypotension is likely secondary to  a low-output state in context of this new rhythm.  He is now in normal  sinus rhythm with improved blood pressure.  His exam suggests that he is  in an euvolemic, though poorly perfused state.  The rhythm causing this  may be due to his metabolic derangements and systemic medical illness.  However, there is a possibility that this is progressive conduction  system disease which would need to be treated with permanent pacemaker.  Particularly if this rhythm returns in context of an improved metabolic  state.  Will monitor very closely on telemetry and consider using  dopamine for blood pressure support instead of phenylephrine for  hypertension in context of LV dysfunction and a poor perfusing state.      Wendi Snipes, MD  Electronically Signed     BHH/MEDQ  D:  06/08/2008  T:  06/08/2008  Job:  [id number]

## 2010-07-14 NOTE — H&P (Signed)
[name], [name]                 ACCOUNT NO.:  [id number]   MEDICAL RECORD NO.:  [id number]          PATIENT TYPE:  EMS   LOCATION:  ED                           [facility]AC[facility]   PHYSICIAN:  [name], MDDATE OF BIRTH:  [birth year]   DATE OF ADMISSION:  05/23/2008  DATE OF DISCHARGE:                              HISTORY & PHYSICAL   PRIMARY CARE [name]:  Dr. Arvilla Hill.   CHIEF COMPLAINT:  Cough, shortness of breath, fever.   The patient is a 76 year old gentleman with a history of diabetes, heart  failure and severe obesity who per his family for the past 2-3 days has  been coughing which is nonproductive and having more shortness of breath  and possibly fevers at home for which they brought him into the  emergency department where his fever was noted to be up to 103.2.  Initially, his blood pressure was stable at 142/72, but while he was  staying here it started to drift down and went down to the 90s.   Chest x-ray did show either bilateral infiltrates or edema, although  clinical picture is most likely pneumonia.  [facility] hospitalist was called  for admission.   REVIEW OF SYSTEMS:  No chest pain particular, no joint aches.  He had  some sick contacts with his daughter a few days ago, who is also sick.  Otherwise, no diarrhea, no nausea, no vomiting, no constipation.  Otherwise, review of systems are negative.   PAST MEDICAL HISTORY:  1. Heart failure.  2. COPD.  3. Diabetes.  4. Hypercholesteremia.  5. History of MI in the past, status post CABG x6 vessels.  6. History of dyslipidemia.  7. Morbid obesity.  8. Stroke.  9. The patient has history of paroxysmal atrial fibrillation for which      he is on Coumadin.   SOCIAL HISTORY:  The patient lives at home.  He does not abuse drugs.  He does not drink alcohol, never smoked.   FAMILY HISTORY:  Noncontributory.   ALLERGIES:  NO KNOWN DRUG ALLERGIES.   MEDICATIONS:  1. Allopurinol 300 mg p.o. daily.  2.  Vytorin 10/40 mg daily.  3. The patient was supposed to be taking Coumadin.  4. Potassium 20 mEq daily.  5. Tarka 2/240 mg daily.  6. Glimepiride 20 mg daily.  7. Hyomax SR 0.375 mg twice daily.  8. Aspirin 81 mg daily.  9. The patient was suppose to take Lasix 80 mg daily, but I am not      sure if he is continuing to take this or not.   PHYSICAL EXAMINATION:  VITAL SIGNS:  Temperature 103.2, blood pressure  142/72, pulse 84, respirations 20.  Saturating 99% on room air.  Currently, temperature down to 99, but blood pressure is also down to  96/50s, pulse 90s-to 100s, respirations 18.  Saturating 99% on 3 liters.  GENERAL:  The patient appears to be in no acute distress.  HEENT:  Head nontraumatic.  Somewhat dry mucous membranes.  LUNGS:  Distant breath sounds bilaterally.  Occasional crackles.  HEART:  Distant heart sounds,  but no severe murmur appreciated.  ABDOMEN:  Severely obese, but nontender, nondistended.  LOWER EXTREMITIES:  Without clubbing, cyanosis or edema.  NEUROLOGIC:  The patient appears to be neurologically intact, talkative,  alert and oriented x3.   LABORATORY DATA:  White blood cell count 4.8, hemoglobin 14.3, sodium  136, potassium 4.2, creatinine 1.7 - I am not sure what his baseline is.  Chest x-ray showing increased right basilar edema versus infiltrates.   EKG not available.   ASSESSMENT/PLAN:  1. This is a 76 year old gentleman with what seems to be more      consistent with pneumonia.  Will admit to intensive care unit given      possible questionable early sepsis and significant infiltrates,      although at this point the patient does look fairly well.  If he      does well, he could easily be downgraded from there on.  We will      continue Rocephin and Zithromax.  Since the patient had a fairly      high-grade fever, we will cover  with Tamiflu and send out H1N1.      The differential could include congestive heart failure or another      source  of infection, this is less likely.  We will follow BNP,      although this could be falsely elevated given renal insufficiency.  2. History of heart failure.  He would likely benefit from a      cardiology consult in a.m. to help manage, especially in the      setting of the possible need to have IV fluids for early sepsis,      follow BNP, hold Lasix.   1. Paroxysmal atrial fibrillation.  We will obtain EKG now.  We will      hold off on Coumadin for today to see which way the patient trends      just in case he may need lines or interventions.  If the patient      improves, we will need to restart.  2. History of coronary artery disease.  No chest pain.  We will cycle      cardiac markers in a setting of hypoperfusion, but even if      elevated, this would not be secondary to a thrombolic event.  3. Elevated creatinine.  Unsure of his baseline.  Per August      admission, his creatinine was 1.2.  We will give IV fluids as the      patient's blood pressure is somewhat down and early sepsis could      not be ruled out.  4. Diabetes.  We will put on sliding scale for now and hold glipizide.  5. History of hypertension.  Hold blood pressure medications.  6. Prophylaxis.  Protonix, sequential compression devices.  The      patient is on Coumadin.   Appreciate critical care medicine help with management of this patient.      Dalton Cowboy, MD  Electronically Signed     AVD/MEDQ  D:  05/23/2008  T:  05/23/2008  Job:  [id number]   cc:   [name], M.D.  Fax: [phone]

## 2010-07-14 NOTE — Consult Note (Signed)
NAME:  [name], [name] NO.:  [id number]   MEDICAL RECORD NO.:  [id number]          PATIENT TYPE:   LOCATION:                                 FACILITY:   PHYSICIAN:  Melvyn Novas, M.D.  DATE OF BIRTH:  08-14-1934   DATE OF CONSULTATION:  DATE OF DISCHARGE:                                 CONSULTATION   This is a 76 year old and 33-month-old African American right-handed  married male with a history of CHF, coronary artery disease, and  diabetes mellitus admitted with fluid overload.  The patient also was  found to have an established old stroke in the left cerebellum.   The patient was noted at 2:00 a.m. to be normal when the nurse gave him  a Lovenox shot.  About 15 minutes later, the son of the patient called  the nurse back and stated that his father had developed dysarthria which  she described as slurring of speech and left-sided weakness.  The nurse  noticed a left-sided facial droop also significant drift in the left arm  slightly less on the left lower extremity.  The patient's slurring of  speech was also significant.  She called an in-house code stroke.  The  son was at the bedside during the evaluation.   CT was obtained at 2:25 a.m. and shows no acute stroke, but an old  cerebellar stroke.  The patient is admitted to Dr. Lyn Hill with  review of systems positive for shortness of breath due to fluid  overload, ischemic dilation of the heart, and diabetes.   CT was negative for acute stroke.   The patient was diuresed overnight and received 1 dose of subcutaneous  heparin, Lovenox.  In his case, Lasix.  He received nitroglycerin and  Lopressor by mouth.   PHYSICAL EXAMINATION:  VITAL SIGNS:  Now show heart rate of 82 regular,  a blood pressure of 118/80, respiratory rate of 22,  97% oxygenation on  room air.  The patient is not on nasal cannula.  An IH stroke code  reached 8 points.  NEUROLOGICAL:  Shows the patient who is alert and  oriented, but appears  to be very easily distractible and is hard to force to concentrate on  the neurologic exam.  The patient is anxious, not very cooperative and  he is now able to repeat.  He can name objects.  He can read for me but  he has significant dysarthria.  He is fluent.  Left facial droop is  milder than originally noticed when the nurse evaluated him 45 minutes  ago.  His left-sided grip is weaker.  The patient can move his left arm  antigravity but it drifts.  He also has left-sided lower extremity drift  to a milder degree.  Sensory to primary modality seems to be intact.  He  does not have showed sign of extinction.  Finger-nose test shows  dysmetria with the left.   LABORATORY DATA:  The patient's laboratory results in review show a beta-  natriuretic peptide of 1540.0, so extremely high.  Cardiac panel showed  a creatinine kinase of 680, a CK-MB of 84.7.  A relative index of 12.5.  A troponin of 3.07.  These tests were ordered on Jul 27, 2007, at 6:00  p.m. and results were just coming in at 3:00 a.m.  Possible myocardial  ischemia was reported.  Glucose 166, BUN 25, and creatinine 1.6.  Sodium  136, potassium 3.6.  Lipid profile shows a cholesterol level of 91 and  HDL of only 24.  CBC shows a white blood cell count of 8.2, H&H is  14/41.9, and platelet count 165,000.  Hemoglobin A1c is 8.  Previous  cardiac panel at 7:00 p.m., the preceding day Jul 27, 2007, shows a  creatinine kinase of 593, a CK-MB of 74, and a troponin of 2.05, so  values have increased.   ASSESSMENT:  Suspected cerebrovascular accident, right hemispheric with  left-sided resulting weakness, facial droop, and dysarthria.   PLAN:  To give a IV t-PA.  The patient did not want to make the decision  of accepting the IV t-PA treatment.  Requested his doctor to be  informed.  Dr. Tawanna Cooler on-call for cardiologist, Dr. Katrinka Blazing was called at  3:10 and agreed with the IV t-PA.  The patient now wants not his  son who  is at the bedside but his wife at the bedside.  Meanwhile, the daughter  arrived at 3:30 a.m.  The patient still demanding his wife.  At 3:40 in  the morning, the patient finally agreed to sign for IV t-PA.      Melvyn Novas, M.D.  Electronically Signed     CD/MEDQ  D:  07/28/2007  T:  07/28/2007  Job:  [id number]   cc:   [name], M.D.  Dalton P. Pearlean Brownie, [name]

## 2010-07-14 NOTE — Discharge Summary (Signed)
[name], [name]                 ACCOUNT NO.:  [id number]   MEDICAL RECORD NO.:  [id number]          PATIENT TYPE:  INP   LOCATION:  5511                         FACILITY:  [facility]   PHYSICIAN:  Hollice Espy, M.D.DATE OF BIRTH:  11-05-1934   DATE OF ADMISSION:  06/07/2008  DATE OF DISCHARGE:                               DISCHARGE SUMMARY   PRIMARY CARE PHYSICIAN:  Donia Guiles, M.D.   CARDIOLOGIST:  Dr. Verdis Prime.   CONSULTANTS ON CASE:  Pulmonary and Critical Care Medicine, Dr.  Marchelle Gearing.   HOSPITAL COURSE:  The patient is a 76 year old African American male  with past medical history of CAD, CHF with an EF of 25% status post CABG  with a NSTEMI in 2009 who has been recently admitted to the Hospital  Service from the 24th to the 30th for pneumonia and possible early  sepsis.  He was discharged home and then return back after feeling very  weak. Chest x-ray noted asymmetric edema versus right lower lung  infection and the patient's blood pressure initially was 99/50 and it  dropped as low as 60 systolic.  The patient was admitted and was felt to  be in shock and felt to be possibly cardiogenic versus septic. He was  started on pressors to keep his blood pressure up and put under Critical  Care Service.  Cardiology was consulted on June 08, 2008 and after an  evaluation of his EKG it then became bradycardia and possible third-  degree AV block versus sinus arrest with junctional escape.  The latter  of which proved to be the case and felt to be hypotension felt to be  secondary to low output state and this was a cardiogenic shock rather  than indeed an infectious process.  As the patient improved he was able  to be weaned off of pressors and was __________ Medicine transferred the  patient to the Hospital Service.  In regards to the patient's junctional  rhythm in cardiogenic shock, this resolved but the patient had gone back  into sinus rhythm.  His beta blocker was  discontinued and he continued  to be monitored.  He is a previous history of atrial fibrillation and  continue to have some episodes of tachycardia over the next several  days.  Dr. Katrinka Blazing was concerned about the possibility of putting the  patient back on beta blocker and by June 11, 2008 after continuing  episodes of tachycardia, put the patient back on Lopressor, however at a  much reduced dose, specifically 25 mg p.o. daily which is half of his  previous dose.  He recommended discontinuation of his Tarka.  The  patient had some episodes of confusion on June 10, 2008 __________ his  dose of Ambien.  He also required aggressive volume resuscitation  initially when he presented with severe hypotension.  He started  complaining of a cough on June 10, 2008.  A BNP was found be elevated  in the 1400s.  A chest x-ray showed increased pulmonary edema.  He was  felt to be more back into an acute systolic  CHF from aggressive volume  rehydration an underlying history of systolic congestive heart failure.  He was started on aggressive IV diuresis.  By June 11, 2008 he was much  improved. His BNP started to trend down to the 1300s __________ daily  which the patient can go home on given that Dr. Katrinka Blazing will follow up  closely with the patient on Friday June 14, 2008. The rest of his  medical issues were stable during this hospitalization.  His Coumadin  level was noted to be slightly supratherapeutic.  This was felt to be  secondary to his volume overload __________ he reached as high as 5 on  his INR but his Coumadin was held in accordance. Following diuresis his  INR had came down and as of June 11, 2008 it was down to 2.9 and the  plan will be for the patient to resume his normal previous Coumadin dose  of 5 mg p.o. daily.   DISPOSITION:  The patient's overall disposition is improved.   ACTIVITY:  His activity will be slowly increased.   DISCHARGE DIET:  Will be a heart-healthy carb  modified diet.   He is being discharged to home.   FOLLOW-UP APPOINTMENTS:  The patient will follow up with his PCP, Donia Guiles in the next 2 weeks.  He will follow up with Dr. Verdis Prime,  his cardiologist, on June 14, 2008.   DISCHARGE DIAGNOSES:  Are as follows.  1. Cardiogenic shock secondary to sinus arrest with ventricular      arrhythmia, now resolved.  2. Secondary hypotension.  3. Acute systolic congestive heart failure, now improved.  4. Drug induced delirium secondary to increased dose of Ambien.  5. Acute on chronic renal failure, now resolved.  6. Supratherapeutic INR secondary to hepatic congestion, now resolved.  7. Diabetes mellitus.  8. History of gout.  9. History of hypertension.   DISCHARGE MEDICATIONS:  1. Jolene Provost is being discontinued.  2. Metoprolol is being decreased from 25 b.i.d. down to 25 daily.   He will resume all previous medicines  1. Aspirin 81.  2. K-Dur 20 mEq.  3. Coumadin 5.  4. Vytorin 10/40.  5. HyoMax SR 0.35 p.o. daily.  6. Glyburide 4.  7. Nitroglycerin patch 0.4 topically daily.  8. Colchicine 0.6 p.o. b.i.d.  9. Lasix 80 p.o. daily.  10.Lantus __________ subcu at bedtime.  11.Ambien 5 p.o. at bedtime.      Hollice Espy, M.D.  Electronically Signed     SKK/MEDQ  D:  06/11/2008  T:  06/11/2008  Job:  [id number]   cc:   Kalman Shan, MD  Lyn Records, M.D.  Donia Guiles, M.D.

## 2010-07-14 NOTE — H&P (Signed)
[name], [name] NO.:  [id number]   MEDICAL RECORD NO.:  [id number]          PATIENT TYPE:  EMS   LOCATION:  MAJO                         FACILITY:  [facility]   PHYSICIAN:  Elmore Guise., M.D.DATE OF BIRTH:  07-09-1934   DATE OF ADMISSION:  10/12/2007  DATE OF DISCHARGE:                              HISTORY & PHYSICAL   PRIORITY ADMISSION HISTORY AND PHYSICAL   PRIMARY CARDIOLOGIST:  Lyn Records, MD, [facility].   HISTORY OF PRESENT ILLNESS:  Mr. [name] is a very pleasant, 76 year old,  African American male, past medical history of coronary artery disease,  CHF, paroxysmal atrial fibrillation, diabetes mellitus, dyslipidemia,  morbid obesity, and history of stroke, who presents with 3-day history  of increasing orthopnea, lower extremity edema, dyspnea on exertion,  weight gain, and nonproductive cough.  Patient does report moments of  dietary indiscretion the last couple of days with increased salt intake.  He initially thought his symptoms would improve, however, his symptoms  have worsened and now are more prevalent.  He has now had decreased  exertional tolerance and shortness of breath with any activity.  He  normally gets around with a wheelchair; however, he gets extremely  dyspneic with moving from the wheelchair or moving and transferring from  any positions.  He does report compliance with his medications.  He is  seen with his wife and son at the bedside.  His wife reports that when  he gets this short of breath she has a difficult time taking care of him  because of his size.   REVIEW OF SYSTEMS:  As per HPI.  All others were negative.   CURRENT MEDICATIONS:  1. Allopurinol 300 mg daily.  2. Aspirin 81 mg daily.  3. Glimepiride 4 mg daily.  4. Hyoscyamine twice daily.  5. Lasix 80 mg twice daily.  6. Metoprolol 25 mg twice daily.  7. Vytorin 10/40 mg daily.  8. Coumadin 5 mg daily.  9. Nitroglycerin patch 0.4 mg applying in the  evening, removing in the      morning.   ALLERGIES:  None.   FAMILY HISTORY:  Positive for heart disease, diabetes, and stroke.   SOCIAL HISTORY:  He is married, remote tobacco history, quit over 50  years ago, no alcohol.  He is mobile with wheelchair assistance.   PHYSICAL EXAMINATION:  VITAL SIGNS:  He is afebrile, temperature is  98.7, blood pressure 110/80, heart rate is 76 showing sinus rhythm,  respirations are 20, saturation 99% on room air.  GENERAL:  He is a very pleasant, elderly, African American male, alert  and oriented x4, in no acute distress.  NECK:  He has JVD to the angle of the jaw lying at 30 degrees supine  position.  LUNGS:  Bibasilar crackles.  HEART:  Regular with 2/6 holosystolic murmur noted.  ABDOMEN:  Obese, soft, nontender, and nondistended, no rebound or  guarding.  EXTREMITIES:  Warm with 2+ pedal and pretibial edema with 1+ pulses.  His strength is decreased throughout but equal in his upper and lower  extremities.  His blood work shows a white blood cell count of 8.2, hemoglobin 12.7,  platelet count of 227, myoglobin of 2.63, MB of 1.6, and troponin-I of  less than 0.05.  He has a BUN and creatinine of 15 and 1.2, a potassium  level of 3.8, and a glucose of 114.  Chest x-ray shows cardiomegaly with  mild volume overload.  His BNP is 2093, last BNP during his  hospitalization in May and June was 1000 range.  EKG shows normal sinus  rhythm, occasional PVC with T-wave inversion in the lateral leads.  His  last echo was done in May of 2009 showing an EF of 25% with moderate  mitral regurgitation noted.   IMPRESSION:  1. Congestive heart failure exacerbation.  2. History of coronary artery disease.  3. Diabetes mellitus.  4. Morbid obesity.  5. History of cerebrovascular accident.  6. History of paroxysmal atrial fibrillation (on chronic Coumadin).   PLAN:  At this time, the patient will be admitted for 24 hour  observation.  He is volume  overloaded but should respond nicely to IV  Lasix.  We will give Lasix 60 mg IV q.12 hours.  We will continue his  nitroglycerin patch.  I will add spironolactone 25 mg once daily to his  regimen.  I discussed with him the importance of his dietary  restrictions.  I think he understands this; however, this has been  discussed with him multiple times looking back at the chart.  He will  have a BNP and a BMP in the morning.  We will repeat his PT-INR with his  evening labs tonight.  Further recommendations per Dr. Katrinka Blazing.  I did  discuss that he could potentially go home with increasing his oral Lasix  dose; however, his wife is very hesitant to take him back home because  of his difficulty with shortness of breath, and I do agree that 24 hour  observation admission is reasonable at this time.      Elmore Guise., M.D.  Electronically Signed     TWK/MEDQ  D:  10/12/2007  T:  10/12/2007  Job:  [id number]   cc:   Lyn Records, M.D.

## 2010-07-14 NOTE — H&P (Signed)
NAMEKINGDAVID, LEINBACH                 ACCOUNT NO.:  [id number]   MEDICAL RECORD NO.:  [id number]          PATIENT TYPE:  INP   LOCATION:  2101                         FACILITY:  [facility]   PHYSICIAN:  Kalman Shan, MD   DATE OF BIRTH:  [birth year]   DATE OF ADMISSION:  06/07/2008  DATE OF DISCHARGE:                              HISTORY & PHYSICAL   TIME OF ADMISSION:  2245 to 2345.   CHIEF COMPLAINT:  Nausea and vomiting.   HISTORY OF PRESENT ILLNESS:  A 76 year old male with a history of CAD,  CHF with an EF of 25%, status post CABG with NSTEMI in 2009 with recent  admit May 22, 2008 to May 28, 2008 for community-acquired pneumonia  and possible early sepsis.  Presenting with an episode of nausea and  vomiting.  States he has had a persistent cough with phlegm since his  discharge, but otherwise has been feeling reasonably well except for  some mild worsening of his chronic dyspnea on exertion.  Today after  dinner, he developed nausea and vomiting x2.  There was no blood.  It  was just food.  Following this, he felt hot and weak, though he never  felt lightheaded or dizzy.  He denies chest pain, shortness of breath.   ALLERGIES:  NO KNOWN DRUG ALLERGIES.   PAST MEDICAL HISTORY:  1. CAD status post NSTEMI.  2. CHF with an EF of 25%.  He does not have a pacemaker or ICD.  3. He has gout.  4. He has a history of hypertension.  5. Paroxysmal atrial fibrillation for which he is on Coumadin.  6. Type 2 diabetes.  7. COPD.  8. He has history of a CVA.  His past CVA was embolic and was treated      with TPA.  9. Morbid obesity.  10.Hyperlipidemia.  11.He had acute renal insufficiency on his last admission that      resolved.  12.On last admission, he also had hypotension that resolved.   MEDICATIONS:  1. Aspirin 81 mg once a day.  2. K-Dur 20 mEq once a day.  3. Coumadin 5 mg once a day.  4. Allopurinol/Vytorin 10/40 once a day.  5. Hyomax SR 0.375 once a day.  6.  Glimepiride 4 mg once a day.  7. Tarka 2/240 once a day.  8. He wears a nitroglycerin patch 0.4 once daily.  9. Colchicine 0.6 mg po b.i.d.  10.Lasix 80 mg once a day.  11.Lantus 8 units once a day.  12.Metoprolol 25 mg twice a day.  13.Avelox DS, taken for 2 additional days after his prior      hospitalization.   SOCIAL HISTORY:  No tobacco, alcohol or drugs.  He lives with his wife  and son.  He is a retired Arboriculturist.   FAMILY HISTORY:  Noncontributory.   REVIEW OF SYSTEMS:  As per HPI.   PHYSICAL EXAMINATION:  VITAL SIGNS:  Temperature 96.8, heart rate 49,  blood pressure 99/50, though it dropped as low as the 60s, O2 sat 92% on  room air.  GENERAL APPEARANCE:  Obese and in moderate distress with persistent  coughing throughout the exam.  HEENT:  No scleral icterus.  Moist mucosa.  He does have JVD to the  angle of the jaw.  PULMONARY:  Bilateral crackles and rhonchi on expiration throughout  again with a consistent cough during the entire encounter, though  nothing was productive.  CARDIAC:  S1 and S2, bradycardic at 50 with no murmur and a regular  rhythm.  GI:  Obese.  Bowel sounds are positive.  No tenderness, rebound or  guarding.  EXTREMITIES:  Bilateral tight edema 1+.  NEURO:  Alert and oriented x3.  Cranial nerves II-XII are intact.  Exam  nonfocal.  PSYCH:  Pleasant with a good sense of humor considering the extent of  his condition.   LABORATORY DATA:  ABG 7.39 pH, PCO2 26, O2 109, bicarb 16.  White blood  cell count 5.8, hemoglobin 13, hematocrit 40, platelets 181, sodium 129,  potassium 5.3, chloride 104, bicarb 16 for an anion gap of 9, BUN 16,  creatinine 1.66, glucose 334, calcium 8.6, troponin less than 0.05, CK-  MB 1.4.   DIAGNOSTICS:  Chest x-ray with asymmetric edema versus right lower lobe  infection.   ASSESSMENT/PLAN:  1. Shock, etiology cardiogenic or septic, though it does not meet      clear sepsis criteria.  Concern is more for a cardiac  etiology      concerning his new EKG, the fact that he is both hypotensive and      tachycardic.  We will place a central line.  Use phenylephrine for      now as vasopressin is not available.  Check an SvO2 and cortisol.      Culture and BNP once the central line is placed assuming that his      coags are within reasonable limits.  Based on the results, we may      need to start dobutamine and consult Cardiology.  Consider a 2-D      echo.  2. Arrhythmia.  His rhythm is junctional with bradycardia and no clear      p waves. Will consult Cardiology.  His EF makes him an ICD      candidate and maybe he needs a pacemaker.  We will defer to their      judgment.  3. Diabetes.  Sliding scale insulin, Lantus.  Check an A1c.  4. With his recent pneumonia as in number 1 will treat with broad abx.      We will give prophylaxis with heparin and a PPI.  5. Also for shock, we will start him on empiric vancomycin and Zosyn.      Have atropine available.  We will await the Cardiology consult to      see if the effects of phenylephrine, follow his labs, to determine      if he needs a central line or whether he will go on to treatment      for possible sepsis, although most likely this is cardiogenic      shock.   STAFF NOTE: I personally elicited hx and did exam and formulated plan.  Did discuss with Dr. Noel Gerold the resident. Patient was recently in for  'pneumonia' but now has new bradycardia and is in shock. There is no  SIRS criteria. I suspect this is cardiogenic shock related to  bradycardia (junctional). We will place central line and manage shock.  [facility] cards will be involved. 60 minutes critical care independent of  teaching time. Rest per Dr. Wells Guiles note      Valetta Close, M.D.  Electronically Signed      Kalman Shan, MD  Electronically Signed    JC/MEDQ  D:  06/08/2008  T:  06/08/2008  Job:  [id number]

## 2010-07-14 NOTE — Consult Note (Signed)
NAMERAMBO, SARAFIAN                 ACCOUNT NO.:  [id number]   MEDICAL RECORD NO.:  [id number]          PATIENT TYPE:  INP   LOCATION:  3113                         FACILITY:  [facility]   PHYSICIAN:  Melvyn Novas, M.D.  DATE OF BIRTH:  February 21, 1935   DATE OF CONSULTATION:  DATE OF DISCHARGE:                                 CONSULTATION   ADMITTING PHYSICIAN:  Dr. Lyn Records.    Dictation ended at this point.      Melvyn Novas, M.D.  Electronically Signed     CD/MEDQ  D:  07/28/2007  T:  07/28/2007  Job:  [id number]

## 2010-07-14 NOTE — Discharge Summary (Signed)
[name], Dalton NO.:  Hill   MEDICAL RECORD NO.:  [id number]          PATIENT TYPE:  INP   LOCATION:  5511                         FACILITY:  [facility]   PHYSICIAN:  Ramiro Harvest, MD    DATE OF BIRTH:  05/13/1934   DATE OF ADMISSION:  06/07/2008  DATE OF DISCHARGE:  06/13/2008                               DISCHARGE SUMMARY   ATTENDING PHYSICIAN:  Ramiro Harvest, MD   ADDENDUM:  This is an addendum to prior discharge summary done by Dr.  Rito Ehrlich of job number [id number].   PRIMARY CARE PHYSICIAN:  Donia Guiles, M.D.   CARDIOLOGIST:  Lyn Records, M.D. of [facility].   ADDENDUM:  For hospitalization please see prior discharge summary per  Dr. Rito Ehrlich.  Addendum, the patient was initially supposed to be  discharged on June 12, 2008.  However, the patient developed a low-  grade temperature of 100.6 on the morning of June 12, 2008.  The  patient remained asymptomatic.  Urinalysis done was negative.  CBC done  was within normal limits.  There was no leukocytosis noted.  The patient  remained afebrile throughout the rest of the day and was monitored  overnight.  The patient on the morning of June 13, 2008, had a  temperature of 100.3 which improved to 97.8 and was afebrile.  CBC  obtained on the morning of June 13, 2008, had a white count of 6.2,  hemoglobin of 13.4, platelets of 185, hematocrit of 40.7 and an ANC of  4.6 with no left shift.  The patient remained asymptomatic and in stable  condition and will be discharged home in stable condition.  The patient  will need to follow up with his PCP in 1-2 weeks.  If the patient  develops any high-grade temperature he will probably need a repeat  urinalysis and a chest x-ray to rule out an infiltrate versus a urinary  tract infection.  The rest of the patient's chronic medical issues  remained stable throughout the hospitalization and the patient will be  discharged in stable and improved  condition.      Ramiro Harvest, MD  Electronically Signed     DT/MEDQ  D:  06/13/2008  T:  06/13/2008  Job:  [id number]   cc:   Donia Guiles, M.D.  Lyn Records, M.D.

## 2010-07-14 NOTE — H&P (Signed)
[name], [name]                 ACCOUNT NO.:  [id number]   MEDICAL RECORD NO.:  [id number]          PATIENT TYPE:  INP   LOCATION:  1824                         FACILITY:  [facility]   PHYSICIAN:  Hollice Espy, M.D.DATE OF BIRTH:  1934-06-29   DATE OF ADMISSION:  07/27/2007  DATE OF DISCHARGE:                              HISTORY & PHYSICAL   PRIMARY CARE PHYSICIAN:  Donia Guiles, M.D.   CARDIOLOGIST:  Lyn Records, M.D.   CHIEF COMPLAINT:  Shortness of breath and chest pain.   HISTORY OF PRESENT ILLNESS:  The patient is a 76 year old African  American male with a past medical history of systolic CHF and an  ejection fraction of 25-35% by echo 3-1/2 years ago, as well as diabetes  mellitus and CAD, status post multivessel CABG who presents to the  emergency room complaining of shortness of breath and chest pain.  He  has been taking his medications, however, his family and the patient  admit that he has been very dietary noncompliant.  He presents with  several days of worsening shortness of breath and nonproductive cough as  well as orthopnea.  In addition, he also today started complaints of  chest pressure, midsternal.  When he came to the emergency room, he had  a chest x-ray which showed evidence of acute CHF.  He initially had a  heart rate of 103 with a blood pressure of 153/82.  He was started on a  nitroglycerin drip and oxygen and since then has improved.  His labs  were drawn and he was found to have BNP of 974, a BUN of 28, creatinine  of 1.8, and a CPK of 380 with an MB of 13.9, and a troponin I of 0.87.  Currently the patient states he is feeling better with the  nitroglycerin.  He still complains of some shortness of breath.  He  denies any headaches, vision changes.  No dysphagia.  No palpitations.  No wheezing.  He does complain of a cough but nonproductive.  No  abdominal pain.  No hematuria, dysuria, constipation, diarrhea, focal  extremity numbness,  weakness or pain.   REVIEW OF SYSTEMS:  Otherwise negative.   PAST MEDICAL HISTORY:  Includes diabetes mellitus, hypertension, status  post multi-vessel CABG, history of CHF with an ejection fraction of 25-  25%, hyperlipidemia, and a history of elevated liver function tests, as  well as obesity.   MEDICATIONS:  The patient is on hyoscyamine b.i.d., Tarka 2/240 p.o.  daily, Lasix 40 p.o. daily, Vytorin 10/40 p.o. daily, metoprolol 50 p.o.  b.i.d., Glyburide 4, aspirin 325 and allopurinol 300.   ALLERGIES:  He had no known drug allergies.   SOCIAL HISTORY:  Denies any tobacco, alcohol or drug use.   FAMILY HISTORY:  Noncontributory.   PHYSICAL EXAMINATION:  VITAL SIGNS:  On admission temp 97.5, heart rate  103, now down to 88, blood pressure 153/82, now down to 107/74,  respirations 22, 02 sat 98% on 2 liters.  GENERAL:  He is alert and oriented x3, in no apparent distress.  HEENT:  Normocephalic,  atraumatic.  Mucous membranes are slightly dry.  He had no carotid bruits.  HEART:  A regular rate and rhythm.  S1 and S2 with a 2/6 systolic  ejection murmur.  LUNGS:  Decreased breath sounds with scattered rales halfway down  bilaterally.  ABDOMEN:  Soft, obese, nontender.  Positive bowel sounds.  EXTREMITIES:  Show no clubbing or cyanosis, and about 1+ nonpitting  edema.   LAB WORK:  BNP 974.  Sodium 134, potassium 3.7, chloride 102, bicarb 20,  BUN 28, creatinine 1.8, glucose 174.  White count 8.5, H&H 13.6 and 41.  MCV 91.  Platelet count 147.  CPK 380.  MB 13.9.  Troponin I shows 0.87.  His chest x-ray is as per HPI, showing signs of acute CHF.  EKG shows a  normal sinus rhythm but some nonspecific ST-T wave abnormalities with a  questionable mild ST depression in leads V5, V6, but not very  impressive, as well as signs of an old infarction.   ASSESSMENT AND PLAN:  1. Congestive heart failure.  Will diurese.  Check a 2D      echocardiogram.  Ask cardiology to see him.  2.  Chest pain.  Check 2 sets of cardiac enzymes.  Wean off      nitroglycerin drip.  3. Hypertension.  Continue p.o. medications.  4. Sliding scale.  Continue medications sliding scale.  5. Dietary noncompliance.  The patient needs education.  6. History of coronary artery disease and coronary artery bypass      graft.      Hollice Espy, M.D.  Electronically Signed     SKK/MEDQ  D:  07/27/2007  T:  07/27/2007  Job:  [id number]   cc:   Donia Guiles, M.D.  Lyn Records, M.D.

## 2010-07-14 NOTE — Discharge Summary (Signed)
NAMEADALBERT, ALBERTO                 ACCOUNT NO.:  [id number]   MEDICAL RECORD NO.:  [id number]          PATIENT TYPE:  INP   LOCATION:  1424                         FACILITY:  [facility]   PHYSICIAN:  Ramiro Harvest, MD    DATE OF BIRTH:  February 20, 1935   DATE OF ADMISSION:  05/22/2008  DATE OF DISCHARGE:  05/28/2008                               DISCHARGE SUMMARY   PRIMARY CARE PHYSICIAN:  Dr. Donia Guiles of [facility].   DISCHARGE DIAGNOSES:  1. Community-acquired pneumonia.  2. Hypotension secondary to early sepsis syndrome secondary to problem      #1, resolved.  3. Chronic systolic heart failure.  4. Acute renal insufficiency, resolved.  5. Atrial fibrillation.  6. History of coronary artery disease.  7. Type 2 diabetes.  8. Dyslipidemia.  9. Morbid obesity.  10.History of cerebrovascular accident.  11.Chronic obstructive pulmonary disease.   DISCHARGE MEDICATIONS:  1. Avelox 400 mg p.o. daily x2 days.  2. Lantus as previously taken.  3. Metoprolol 25 mg p.o. b.i.d.  4. Lasix 80 mg p.o. daily.  5. Aspirin 81 mg p.o. daily.  6. Klor-Con 20 mEq p.o. daily.  7. Coumadin 5 mg p.o. daily.  8. Allopurinol 300 mg p.o. daily.  9. Vytorin 10/40 p.o. daily.  10.HyoMax-SR 0.375 mg p.o. b.i.d.  11.Glimepiride 4 mg p.o. daily.  12.Tarka 2/240 mg p.o. daily.  13.Nitroglycerin transdermal 0.4 mg patches daily.  14.Colchicine 0.6 mg p.o. b.i.d. p.r.n.   DISPOSITION AND FOLLOWUP:  Patient will be discharged home.  Patient is  to follow up with his PCP in 1 week and followup basic metabolic profile  needs to be checked to follow up on patient's electrolytes and renal  function.  Also, patient will need a chest x-ray done in 4 to 6 weeks  for followup on his pneumonia.  Patient is also to follow up with Riki Rusk  in the Cou[facility] at [facility] in 2 days on May 30, 2008,  for PT/INR check.   CONSULTATIONS DONE:  A critical care consult was done.  Patient was seen  in  consultation on May 23, 2008.  Patient was seen in consultation by  Dr. Vassie Loll of [name].   PROCEDURES PERFORMED:  1. A chest x-ray was done on May 23, 2008, that showed interval      increase in perihilar and bibasilar edema or infiltrate stable      cardiomegaly.  2. Renal ultrasound was done on May 23, 2008, that showed no      hydronephrosis, simple-appearing left renal cyst.  3. Chest x-ray was done on May 24, 2008, that showed cardiomegaly,      bibasilar infiltrates increased on the right since previous exam,      question asymmetric edema versus pneumonia.  4. A chest x-ray done May 25, 2008, showed hazy bilateral perihilar      and lower lobe infiltrate slightly improved on the right.   BRIEF ADMISSION HISTORY AND PHYSICAL:  Mr. [name] is a 76 year old  gentleman, history of diabetes, CHF, severe obesity who per family for  the  past 2 to 3 days has been coughing and nonproductive cough, some  increased shortness of breath, and possibly fevers at home for which  they brought him into the emergency department where fever was noted up  to 103.2.  Initially, patient's blood pressure was stable at 142/72 but  while he was in the ED started to drift down into systolics in the 90s.  Chest x-ray did show either bilateral infiltrates or edema although a  clinical picture was most likely pneumonia.  [facility] was  called for admission.   PHYSICAL EXAM:  Per admitting physician.  Temperature 103.2.  Blood  pressure 142/72.  Pulse of 84.  Respirations 20.  Saturating 99% on room  air.  Temperature down to 99.  Blood pressure down to 96/50.  Pulse in  the 90s to 100s.  Respirations 18.  Saturating 99% on 3 L.  GENERAL:  Patient in no acute distress.  HEENT:  Normocephalic, atraumatic.  Pupils equal, round, and reactive to  light.  Extraocular movements intact.  Oropharynx was clear.  No  lesions.  No exudates.  NECK:  Supple.  Dry mucous  membranes.  RESPIRATORY:  Distant breath sounds bilaterally.  Occasional crackles.  CARDIOVASCULAR:  Distant heart sounds.  ABDOMEN:  Severely obese but nontender, nondistended.  EXTREMITIES:  No clubbing, cyanosis, or edema.  NEUROLOGICAL:  Patient was neurologically intact, talkative, alert and  oriented x3.   ADMISSION LABS:  CBC, white count 4.8, hemoglobin 14.3, sodium 136,  potassium 4.2, creatinine of 1.7.  Chest x-ray showing increased right  basilar edema versus infiltrates.   HOSPITAL COURSE:  1. Community-acquired pneumonia.  Patient was admitted into the      hospital for probable community-acquired pneumonia.  Blood cultures      were obtained.  A urine strep antigen and a urine pneumococcus      antigen were also obtained which came back negative.  Blood      cultures were negative on the day of discharge.  Patient was placed      on IV Rocephin and azithromycin, as well as gentle IV fluids to      monitor for volume overload.  Patient was placed on IV Rocephin and      azithromycin, initially placed in the step-down unit secondary to      being hypotensive which was felt patient might be having some early      sepsis.  Patient was hydrated gently.  Patient improved clinically      and symptomatically and improved on a daily basis.  Patient's IV      Rocephin and azithromycin were discontinued and patient was placed      on Avelox which patient tolerated.  Patient will be discharged home      on 2 more days of Avelox to complete a 7-day course for      antibiotics.  On day of discharge, patient was in stable and      improved condition to be discharged home.  Patient was empirically      treated with 5 days of Tamiflu for probable H1N1.  H1N1 was checked      which came back negative.  2. Hypotension, felt to be secondary to early sepsis syndrome      secondary to problem #1.  Patient was gently hydrated with IV      fluids monitoring closely for volume overload.  CCM  was consulted      and was seen in consultation  with the patient and monitored the      patient closely and patient improved with IV fluids.  Patient's      hypotension had resolved and patient was then transferred from the      step-down unit to the telemetry floor where the patient's blood      pressure remained stable and patient will be discharged home in      stable and improved condition.  Patient was treated with IV      antibiotics of Rocephin and azithromycin and subsequently changed      to oral Avelox.  Patient will be discharged in stable and improved      condition.  3. Chronic systolic heart failure, was stable during the      hospitalization.  Patient was hydrated gently with IV fluids.  BNPs      were monitored.  BNP was elevated as high as 1100; however, patient      remained clinically euvolemic.  patient was given a one-time dose      of Lasix 20 mg IV and then placed back on his home regimen of Lasix      of initially of 40 mg daily and subsequently increased to his home      dose of 80 mg daily.  Patient remained in stable condition.      Cardiac enzymes were also cycled which were negative and patient's      chronic systolic heart failure was compensated during the      hospitalization and patient will be discharged back home on his      home regimen of cardiac medications of verapamil, metoprolol, and      Lasix which were restarted back during the hospitalization.  4. Acute renal insufficiency.  Patient on admission was noted to have      an acute renal insufficiency with a creatinine of 1.7.  It was felt      this was a prerenal azotemia secondary to his hypotension.  Patient      was volume resuscitated gently with IV fluids with daily      improvement in his renal function such that by day of discharge      patient's creatinine was 1.38 and had resolved.  Patient will be      discharged in stable condition.   The rest of patient's chronic medical issues  remained stable throughout  the hospitalization and patient will be discharged in stable and  improved condition.  Patient is to follow up with his PCP as stated  above.  It was a pleasure taking care of [name].  On day of  discharge, vital signs, temperature 98.0, blood pressure 138/93, pulse  of 78, respirations 18, saturating 100% on room air.   DISCHARGE LABS:  Sodium 140, potassium 3.8, chloride 108, bicarb 26, BUN  23, creatinine 1.38, glucose of 76, calcium of 9.1, PT of 31.5, INR of  2.8.  CBC with a white count of 6.9, hemoglobin of 13.1, platelets of  198, hematocrit of 40.0.  H1N1 was negative.   It was a pleasure taking care of [name].      Ramiro Harvest, MD  Electronically Signed     DT/MEDQ  D:  05/28/2008  T:  05/28/2008  Job:  [id number]   cc:   Donia Guiles, M.D.  Fax: [phone]   Lyn Records, M.D.  Fax: [phone]

## 2010-07-17 NOTE — Op Note (Signed)
[name]  Patient:    [name], [name]                          MRN: [id number] Proc. Date: 07/17/99 Adm. Date:  [id number] Disc. Date: [id number] Attending:  Andre Lefort CC:         Desma Maxim, M.D.             Celso Sickle, M.D.                           Operative Report  DATE OF BIRTH:  2034-10-07  CCS NUMBER:  [id number]  PREOPERATIVE DIAGNOSIS:  Chronic cholecystitis and cholelithiasis.  POSTOPERATIVE DIAGNOSIS:  Chronic cholecystitis and cholelithiasis.  PROCEDURES:  Laparoscopic cholecystectomy with intraoperative cholangiogram.  SURGEON:  Sandria Bales. Ezzard Standing, M.D.  FIRST ASSISTANT:  Rose Phi. Maple Hudson, M.D.  ANESTHESIA:  General endotracheal.  ESTIMATED BLOOD LOSS:  Minimal.  INDICATIONS:  [name] is a 76 year old black male who is morbidly obese.  He was admitted to [facility] from Jul 12, 1999, to Jul 16, 1999, with pancreatitis and documented gallstones.  He was discharged from [facility] yesterday with resolution of his pancreatitis and comes today for attempted laparoscopic cholecystectomy. The indications and complications were explained to the patient.  DESCRIPTION OF PROCEDURE:  With the patient in the supine position, received a general endotracheal anesthesia.  He had 1 g of Ancef at initiation of the procedure.  He had PA stockings in place and an orogastric tube in place.  His abdomen was prepped with Betadine solution and sterilely draped.  The patient was morbid obesity, which really made dissection into the abdominal cavity somewhat difficult.  An infraumbilical incision was made with sharp dissection carried down to the abdominal cavity.  At first I tried a regular Ethicon trocar, however, I had to switch to the [employer] Hasson trocar and inserted this into the abdominal cavity with the 0 degree scope.  We then inserted a 10 mm Ethicon trocar in the  subxiphoid location, a 5 mm Ethicon trocar in the mid subcostal, and a 5 mm Ethicon trocar in the right lateral subcostal location.  It was clear that I needed first way to retract and then go down and the second thing was that the camera was remote enough that we needed to get the camera closer.  So I put two more ports, a 12 mm Ethicon trocar toward the midline up about 7-8 cm above the umbilicus and then a 12 mm up a little to the left of the midline as I put a blue [employer] retractor in the abdomen.  After these were inserted, I was able to get a pretty good visualization of the gallbladder.  The gallbladder was noted to be both chronically and subacutely inflamed.  I decompressed the gallbladder with a needle aspirating out most of the liquid contents.  I then started dissection around the cystic duct/gallbladder junction.  The cystic artery was identified and triply endoclipped.  The cystic duct was identified.  I tried to shoot an intraoperative cholangiogram using a cut off taut catheter inserted into the abdominal cavity using a 14 gauge ______ into the cystic duct and secured with an endoclip.  The intraoperative cholangiogram showed free flow of contrast down the cystic duct into the common bile duct into the duodenum.  There was no obvious filling defect.  The contrast did reflux back into the hepatic radicles.  I had to put the patient in Trendelenburg to get him to do really well.  There was no evidence of any impingement on the common bile duct and no residual filling defects.  There was some leaking at the cystic duct, but that was just from the clip that I had placed on.  It was felt to be a normal intraoperative cholangiogram without obvious obstruction.  The taut catheter was then removed and the cystic duct triply endoclipped and the cystic duct divided.  The gallbladder was then sharply and bluntly dissected from the gallbladder bed.  Again, the gallbladder  had been chronically inflamed, as well as acutely inflamed.  It had a very thickened wall and actually had some patchy necrotic areas on the back wall against the liver.  I got to the gallbladder on multiple occasions and spilled some stones, which I retrieved.  When the gallbladder was completely divided, I left small remnant of gallbladder wall on the gallbladder bed.  I bovied this.  I then examined the triangle of Calot and the gallbladder bed.  There was no bile leak and no bleeding.  I then delivered the gallbladder into the endocatch bag and irrigated with abdomen with 2 L of saline.  I did not think that the patient needed any kind of drain.  We delivered the gallbladder through the umbilicus and sent this to pathology.  The umbilical port was then closed with a 0 Vicryl suture.  Each of the ports were directly visualized.  There was no bleeding in any port and they were removed under direct visualization.  The skin was closed either with a 5-0 or a 4-0 Vicryl suture, painted with tincture of Benzoin, steri-stripped, and sterilely dressed.  The patient tolerated the procedure well despite his obesity.  The sponge and needle counts were correct at the end of the case. DD:  07/17/99 TD:  07/21/99 Job: [id number]

## 2010-08-19 ENCOUNTER — Ambulatory Visit (HOSPITAL_BASED_OUTPATIENT_CLINIC_OR_DEPARTMENT_OTHER): Payer: Self-pay

## 2010-11-25 LAB — CBC
HCT: 39.3
HCT: 39.5
HCT: 41.4
HCT: 41.9
Hemoglobin: 13.5
Hemoglobin: 14
MCHC: 33.3
MCV: 90.3
MCV: 91
Platelets: 147 — ABNORMAL LOW
Platelets: 165
Platelets: 167
Platelets: 181
RBC: 4.58
RDW: 15
RDW: 15.5
WBC: 6.9
WBC: 8.2
WBC: 8.5
WBC: 8.9

## 2010-11-25 LAB — LIPID PANEL
Cholesterol: 91
HDL: 24 — ABNORMAL LOW
LDL Cholesterol: 46
Triglycerides: 107

## 2010-11-25 LAB — BASIC METABOLIC PANEL
BUN: 25 — ABNORMAL HIGH
BUN: 25 — ABNORMAL HIGH
BUN: 27 — ABNORMAL HIGH
BUN: 28 — ABNORMAL HIGH
Calcium: 8.9
Chloride: 100
Chloride: 102
Chloride: 104
Creatinine, Ser: 1.8 — ABNORMAL HIGH
GFR calc non Af Amer: 42 — ABNORMAL LOW
Glucose, Bld: 111 — ABNORMAL HIGH
Glucose, Bld: 166 — ABNORMAL HIGH
Glucose, Bld: 174 — ABNORMAL HIGH
Potassium: 3.6
Potassium: 3.6
Sodium: 136
Sodium: 136

## 2010-11-25 LAB — COMPREHENSIVE METABOLIC PANEL
ALT: 47
Alkaline Phosphatase: 84
BUN: 26 — ABNORMAL HIGH
Chloride: 105
Glucose, Bld: 137 — ABNORMAL HIGH
Potassium: 3.5
Sodium: 138
Total Bilirubin: 2.5 — ABNORMAL HIGH

## 2010-11-25 LAB — B-NATRIURETIC PEPTIDE (CONVERTED LAB)
Pro B Natriuretic peptide (BNP): 1132 — ABNORMAL HIGH
Pro B Natriuretic peptide (BNP): 1540 — ABNORMAL HIGH
Pro B Natriuretic peptide (BNP): 974 — ABNORMAL HIGH

## 2010-11-25 LAB — DIFFERENTIAL
Eosinophils Absolute: 0.3
Eosinophils Relative: 4
Lymphs Abs: 1.3
Monocytes Absolute: 0.8
Monocytes Relative: 9

## 2010-11-25 LAB — CARDIAC PANEL(CRET KIN+CKTOT+MB+TROPI)
CK, MB: 40.5 — ABNORMAL HIGH
CK, MB: 78.2 — ABNORMAL HIGH
Relative Index: 10.9 — ABNORMAL HIGH
Relative Index: 12.5 — ABNORMAL HIGH
Relative Index: 12.5 — ABNORMAL HIGH
Total CK: 501 — ABNORMAL HIGH
Total CK: 680 — ABNORMAL HIGH
Troponin I: 3.07

## 2010-11-25 LAB — CK TOTAL AND CKMB (NOT AT ARMC)
CK, MB: 26.3 — ABNORMAL HIGH
Relative Index: 6.2 — ABNORMAL HIGH
Total CK: 380 — ABNORMAL HIGH

## 2010-11-25 LAB — HEMOGLOBIN A1C
Hgb A1c MFr Bld: 8 — ABNORMAL HIGH
Mean Plasma Glucose: 208

## 2010-11-25 LAB — HEPARIN LEVEL (UNFRACTIONATED)
Heparin Unfractionated: 0.45
Heparin Unfractionated: 0.55

## 2010-11-26 LAB — BASIC METABOLIC PANEL
BUN: 22
CO2: 24
Chloride: 106
Creatinine, Ser: 1.62 — ABNORMAL HIGH
GFR calc Af Amer: 47 — ABNORMAL LOW
GFR calc non Af Amer: 42 — ABNORMAL LOW
Glucose, Bld: 101 — ABNORMAL HIGH
Potassium: 3.6
Potassium: 3.9
Sodium: 137

## 2010-11-26 LAB — DIFFERENTIAL
Basophils Absolute: 0
Basophils Absolute: 0
Basophils Absolute: 0
Basophils Absolute: 0
Basophils Relative: 0
Basophils Relative: 0
Basophils Relative: 0
Eosinophils Absolute: 0.1
Eosinophils Absolute: 0.2
Eosinophils Absolute: 0.2
Eosinophils Absolute: 0.3
Eosinophils Relative: 2
Eosinophils Relative: 3
Lymphocytes Relative: 11 — ABNORMAL LOW
Lymphocytes Relative: 12
Lymphs Abs: 1.1
Monocytes Absolute: 0.3
Monocytes Absolute: 0.6
Monocytes Absolute: 0.7
Monocytes Relative: 8
Neutro Abs: 5.8
Neutrophils Relative %: 79 — ABNORMAL HIGH
Neutrophils Relative %: 80 — ABNORMAL HIGH
Neutrophils Relative %: 83 — ABNORMAL HIGH

## 2010-11-26 LAB — HEPARIN LEVEL (UNFRACTIONATED)
Heparin Unfractionated: 0.33
Heparin Unfractionated: 0.36
Heparin Unfractionated: 0.42
Heparin Unfractionated: 0.55

## 2010-11-26 LAB — CBC
HCT: 36.2 — ABNORMAL LOW
HCT: 37.1 — ABNORMAL LOW
HCT: 38.4 — ABNORMAL LOW
HCT: 39
Hemoglobin: 12.2 — ABNORMAL LOW
Hemoglobin: 12.7 — ABNORMAL LOW
MCHC: 33.6
MCHC: 33.8
MCHC: 33.8
MCHC: 34.4
MCV: 90.5
MCV: 90.8
MCV: 91
MCV: 91.5
Platelets: 188
Platelets: 194
Platelets: 196
Platelets: 197
RBC: 3.98 — ABNORMAL LOW
RDW: 15.1
RDW: 15.2
RDW: 15.4
RDW: 15.5
WBC: 8.6

## 2010-11-26 LAB — PROTIME-INR
INR: 1.2
INR: 2.3 — ABNORMAL HIGH
Prothrombin Time: 19.6 — ABNORMAL HIGH

## 2010-11-27 LAB — PROTIME-INR
INR: 2.6 — ABNORMAL HIGH
Prothrombin Time: 28.4 — ABNORMAL HIGH
Prothrombin Time: 29.2 — ABNORMAL HIGH

## 2010-11-27 LAB — BASIC METABOLIC PANEL
BUN: 14
BUN: 15
Calcium: 9.2
GFR calc non Af Amer: 54 — ABNORMAL LOW
GFR calc non Af Amer: 58 — ABNORMAL LOW
Glucose, Bld: 114 — ABNORMAL HIGH
Glucose, Bld: 81
Potassium: 3.8
Sodium: 137

## 2010-11-27 LAB — POCT CARDIAC MARKERS: Myoglobin, poc: 163

## 2010-11-27 LAB — CBC
HCT: 38.7 — ABNORMAL LOW
MCHC: 32.7
MCV: 91.4
Platelets: 227
RDW: 17 — ABNORMAL HIGH

## 2010-11-27 LAB — DIFFERENTIAL
Basophils Absolute: 0
Basophils Relative: 0
Eosinophils Absolute: 0.1
Eosinophils Relative: 1
Lymphocytes Relative: 6 — ABNORMAL LOW

## 2011-08-22 ENCOUNTER — Encounter (HOSPITAL_COMMUNITY): Payer: Self-pay | Admitting: *Deleted

## 2011-08-22 ENCOUNTER — Observation Stay (HOSPITAL_COMMUNITY)
Admission: EM | Admit: 2011-08-22 | Discharge: 2011-08-25 | Disposition: A | Discharge status: Home / Self Care | Payer: Medicare Other | Source: Ambulatory Visit | Attending: Internal Medicine | Admitting: Internal Medicine

## 2011-08-22 DIAGNOSIS — R079 Chest pain, unspecified: Principal | ICD-10-CM

## 2011-08-22 DIAGNOSIS — I2589 Other forms of chronic ischemic heart disease: Secondary | ICD-10-CM | POA: Insufficient documentation

## 2011-08-22 DIAGNOSIS — I5022 Chronic systolic (congestive) heart failure: Secondary | ICD-10-CM | POA: Insufficient documentation

## 2011-08-22 DIAGNOSIS — I509 Heart failure, unspecified: Secondary | ICD-10-CM | POA: Insufficient documentation

## 2011-08-22 DIAGNOSIS — I2510 Atherosclerotic heart disease of native coronary artery without angina pectoris: Secondary | ICD-10-CM | POA: Insufficient documentation

## 2011-08-22 DIAGNOSIS — N183 Chronic kidney disease, stage 3 unspecified: Secondary | ICD-10-CM | POA: Insufficient documentation

## 2011-08-22 DIAGNOSIS — Z7982 Long term (current) use of aspirin: Secondary | ICD-10-CM | POA: Insufficient documentation

## 2011-08-22 DIAGNOSIS — I129 Hypertensive chronic kidney disease with stage 1 through stage 4 chronic kidney disease, or unspecified chronic kidney disease: Secondary | ICD-10-CM | POA: Insufficient documentation

## 2011-08-22 DIAGNOSIS — R197 Diarrhea, unspecified: Secondary | ICD-10-CM | POA: Insufficient documentation

## 2011-08-22 DIAGNOSIS — E785 Hyperlipidemia, unspecified: Secondary | ICD-10-CM | POA: Insufficient documentation

## 2011-08-22 DIAGNOSIS — I4891 Unspecified atrial fibrillation: Secondary | ICD-10-CM | POA: Insufficient documentation

## 2011-08-22 DIAGNOSIS — Z79899 Other long term (current) drug therapy: Secondary | ICD-10-CM | POA: Insufficient documentation

## 2011-08-22 DIAGNOSIS — E119 Type 2 diabetes mellitus without complications: Secondary | ICD-10-CM | POA: Insufficient documentation

## 2011-08-22 DIAGNOSIS — R627 Adult failure to thrive: Secondary | ICD-10-CM | POA: Insufficient documentation

## 2011-08-22 HISTORY — DX: Atherosclerotic heart disease of native coronary artery without angina pectoris: I25.10

## 2011-08-22 HISTORY — DX: Gout, unspecified: M10.9

## 2011-08-22 HISTORY — DX: Essential (primary) hypertension: I10

## 2011-08-22 HISTORY — DX: Hyperlipidemia, unspecified: E78.5

## 2011-08-22 LAB — PROTIME-INR: INR: 2.99 — ABNORMAL HIGH (ref 0.00–1.49)

## 2011-08-22 LAB — CBC
MCH: 28.2 pg (ref 26.0–34.0)
MCV: 89.5 fL (ref 78.0–100.0)
Platelets: 172 10*3/uL (ref 150–400)
RDW: 18.3 % — ABNORMAL HIGH (ref 11.5–15.5)
WBC: 7.7 10*3/uL (ref 4.0–10.5)

## 2011-08-22 LAB — URINALYSIS, ROUTINE W REFLEX MICROSCOPIC
Bilirubin Urine: NEGATIVE
Glucose, UA: NEGATIVE mg/dL
Hgb urine dipstick: NEGATIVE
Nitrite: NEGATIVE
Specific Gravity, Urine: 1.01 (ref 1.005–1.030)
pH: 6.5 (ref 5.0–8.0)

## 2011-08-22 LAB — COMPREHENSIVE METABOLIC PANEL
ALT: 17 U/L (ref 0–53)
AST: 20 U/L (ref 0–37)
Albumin: 3.2 g/dL — ABNORMAL LOW (ref 3.5–5.2)
Calcium: 9.3 mg/dL (ref 8.4–10.5)
Sodium: 137 mEq/L (ref 135–145)
Total Protein: 7.5 g/dL (ref 6.0–8.3)

## 2011-08-22 LAB — GLUCOSE, CAPILLARY: Glucose-Capillary: 81 mg/dL (ref 70–99)

## 2011-08-22 LAB — CK: Total CK: 43 U/L (ref 7–232)

## 2011-08-22 LAB — DIFFERENTIAL
Basophils Absolute: 0 10*3/uL (ref 0.0–0.1)
Eosinophils Absolute: 0.2 10*3/uL (ref 0.0–0.7)
Eosinophils Relative: 3 % (ref 0–5)
Neutrophils Relative %: 81 % — ABNORMAL HIGH (ref 43–77)

## 2011-08-22 LAB — CARDIAC PANEL(CRET KIN+CKTOT+MB+TROPI)
Relative Index: INVALID (ref 0.0–2.5)
Troponin I: <0.3 ng/mL (ref <0.30)

## 2011-08-22 MED ORDER — FUROSEMIDE 80 MG PO TABS
120.0000 mg | ORAL_TABLET | Freq: Every day | ORAL | Status: DC
  Administered 2011-08-23 – 2011-08-25 (×3): 120 mg via ORAL
  Filled 2011-08-22 (×3): qty 1

## 2011-08-22 MED ORDER — SODIUM CHLORIDE 0.9 % IV SOLN: INTRAVENOUS | Status: DC

## 2011-08-22 MED ORDER — GLIMEPIRIDE 4 MG PO TABS
4.0000 mg | ORAL_TABLET | Freq: Every day | ORAL | Status: DC
  Filled 2011-08-22 (×2): qty 1

## 2011-08-22 MED ORDER — SODIUM CHLORIDE 0.9 % IJ SOLN
3.0000 mL | Freq: Two times a day (BID) | INTRAMUSCULAR | Status: DC
  Administered 2011-08-23 – 2011-08-25 (×5): 3 mL via INTRAVENOUS

## 2011-08-22 MED ORDER — FEBUXOSTAT 40 MG PO TABS
80.0000 mg | ORAL_TABLET | Freq: Every day | ORAL | Status: DC
  Administered 2011-08-23 – 2011-08-25 (×3): 80 mg via ORAL
  Filled 2011-08-22 (×3): qty 2

## 2011-08-22 MED ORDER — ISOSORBIDE MONONITRATE ER 60 MG PO TB24
60.0000 mg | ORAL_TABLET | Freq: Every day | ORAL | Status: DC
  Administered 2011-08-23 – 2011-08-25 (×3): 60 mg via ORAL
  Filled 2011-08-22 (×3): qty 1

## 2011-08-22 MED ORDER — COLCHICINE 0.6 MG PO TABS
0.6000 mg | ORAL_TABLET | Freq: Two times a day (BID) | ORAL | Status: DC
  Administered 2011-08-23 – 2011-08-25 (×6): 0.6 mg via ORAL
  Filled 2011-08-22 (×7): qty 1

## 2011-08-22 MED ORDER — LATANOPROST 0.005 % OP SOLN
1.0000 [drp] | Freq: Every day | OPHTHALMIC | Status: DC
  Administered 2011-08-23 – 2011-08-24 (×3): 1 [drp] via OPHTHALMIC
  Filled 2011-08-22: qty 2.5

## 2011-08-22 MED ORDER — INSULIN ASPART 100 UNIT/ML ~~LOC~~ SOLN: 0.0000 [IU] | Freq: Three times a day (TID) | SUBCUTANEOUS | Status: DC

## 2011-08-22 MED ORDER — POTASSIUM CHLORIDE CRYS ER 20 MEQ PO TBCR
20.0000 meq | EXTENDED_RELEASE_TABLET | Freq: Every day | ORAL | Status: DC
  Administered 2011-08-23 – 2011-08-25 (×3): 20 meq via ORAL
  Filled 2011-08-22 (×4): qty 1

## 2011-08-22 MED ORDER — AMIODARONE HCL 200 MG PO TABS
200.0000 mg | ORAL_TABLET | Freq: Every day | ORAL | Status: DC
  Administered 2011-08-23 – 2011-08-25 (×3): 200 mg via ORAL
  Filled 2011-08-22 (×3): qty 1

## 2011-08-22 MED ORDER — GERHARDT'S BUTT CREAM
1.0000 "application " | TOPICAL_CREAM | Freq: Two times a day (BID) | CUTANEOUS | Status: DC
  Administered 2011-08-23 – 2011-08-25 (×6): 1 via TOPICAL
  Filled 2011-08-22: qty 1

## 2011-08-22 MED ORDER — CLONAZEPAM 0.5 MG PO TABS: 0.5000 mg | ORAL_TABLET | Freq: Every evening | ORAL | Status: DC | PRN

## 2011-08-22 MED ORDER — METOPROLOL TARTRATE 25 MG PO TABS
25.0000 mg | ORAL_TABLET | Freq: Two times a day (BID) | ORAL | Status: DC
  Administered 2011-08-23 – 2011-08-25 (×3): 25 mg via ORAL
  Filled 2011-08-22 (×7): qty 1

## 2011-08-22 MED ORDER — ASPIRIN 81 MG PO CHEW
324.0000 mg | CHEWABLE_TABLET | Freq: Once | ORAL | Status: AC
  Administered 2011-08-22: 324 mg via ORAL
  Filled 2011-08-22: qty 4

## 2011-08-22 MED ORDER — ATORVASTATIN CALCIUM 40 MG PO TABS
40.0000 mg | ORAL_TABLET | Freq: Every day | ORAL | Status: DC
  Administered 2011-08-23 – 2011-08-25 (×3): 40 mg via ORAL
  Filled 2011-08-22 (×3): qty 1

## 2011-08-22 MED ORDER — INSULIN ASPART 100 UNIT/ML ~~LOC~~ SOLN: 0.0000 [IU] | SUBCUTANEOUS | Status: DC

## 2011-08-22 MED ORDER — DIPHENHYDRAMINE HCL 50 MG/ML IJ SOLN
25.0000 mg | Freq: Once | INTRAMUSCULAR | Status: AC
  Administered 2011-08-22: 25 mg via INTRAVENOUS
  Filled 2011-08-22: qty 1

## 2011-08-22 MED ORDER — ASPIRIN EC 81 MG PO TBEC
81.0000 mg | DELAYED_RELEASE_TABLET | Freq: Every day | ORAL | Status: DC
  Administered 2011-08-23 – 2011-08-25 (×3): 81 mg via ORAL
  Filled 2011-08-22 (×3): qty 1

## 2011-08-22 NOTE — ED Provider Notes (Signed)
History     CSN: [id number]  Arrival date & time 08/22/11  1550   First MD Initiated Contact with Patient 08/22/11 1551      Chief Complaint  Patient presents with  . Skin Ulcer     Patient is a 77 y.o. male presenting with rash. The history is provided by the patient.  Rash  This is a chronic problem. The current episode started more than 1 week ago. The problem has been gradually worsening. The problem is associated with nothing. Affected Location: buttocks. The pain is mild. The pain has been constant since onset. Associated symptoms include itching.  improved by nothing Worsened by - nothing  Pt presents from home for rash/ulcer to buttocks He reports he has difficulty walking, reports he is mostly immobile in bed He reports he has seen his PCP recently [facility] primary) but usually needs assistance to move He reports he has pain/weakness in his legs from "gout" He reports he has difficulty moving his left leg, and he is unsure when he had weakness in that leg Denies any urinary/fecal incontnence  He denies cp/sob/fever/abdominal pain No arm weakness No HA   Past Medical History  Diagnosis Date  . Gout   . Diabetes mellitus     Past Surgical History  Procedure Date  . Coronary artery bypass graft     No family history on file.  History  Substance Use Topics  . Smoking status: Not on file  . Smokeless tobacco: Not on file  . Alcohol Use:       Review of Systems  Constitutional: Negative for fever.  Gastrointestinal: Negative for abdominal pain.  Skin: Positive for itching and rash.  All other systems reviewed and are negative.    Allergies  Review of patient's allergies indicates no known allergies.  Home Medications  No current outpatient prescriptions on file.  BP 122/82  Pulse 68  Temp 98.7 F (37.1 C) (Oral)  Resp 18  SpO2 94%  Physical Exam CONSTITUTIONAL: Well developed/well nourished HEAD AND FACE: Normocephalic/atraumatic EYES:  EOMI/PERRL ENMT: Mucous membranes moist NECK: supple no meningeal signs SPINE:entire spine nontender CV: S1/S2 noted, LUNGS: Lungs are clear to auscultation bilaterally, no apparent distress ABDOMEN: soft, nontender, no rebound or guarding, he is obese.   GU:no cva tenderness.  Normal external genitalia.  Chaperone present Rectal - no saddle anesthesia - chaperone present NEURO: Pt is awake/alert, no arm drift.  No facial droop.  He is able to resist gravity with right LE.  He has difficulty resisting gravity with left LE.  He denies numbness to left LE EXTREMITIES: pulses normal, full ROM.  Distal pulse intact on left LE SKIN: warm, color normal.  No abscess or large ulcer to buttocks.  He has skin excoriation and small amt of bleeding as patient reports "it itches".  No crepitance.  Erythema is noted to area [facility]: no abnormalities of mood noted  ED Course  Procedures    Labs Reviewed  CBC  DIFFERENTIAL  COMPREHENSIVE METABOLIC PANEL  URINALYSIS, ROUTINE W REFLEX MICROSCOPIC  PROTIME-INR  4:20 PM Pt mostly immobile in past year, reports left leg weakness unclear when this started, he attributed to gout Labs ordered He is in no distress at this time 5:53 PM Family at bedside They reports he has had decreased PO and also had CP recently due to stress and felt weak Pt now admits to chest pain (initially denied CP) Will send troponin and will need admission  D/w dr Selena Batten will  admit for chest pain and generalized weakness. Also has left LE weakness but no significant abd/back pain complaints, unclear when this started and can be worked up as inpatient.  Could also be distant CVA as cause.      MDM  Nursing notes including past medical history and social history reviewed and considered in documentation labs/vitals reviewed and considered       Date: 08/22/2011  Rate: 69  Rhythm: normal sinus rhythm  QRS Axis: normal  Intervals: PR prolonged  ST/T Wave abnormalities:  nonspecific ST changes  Conduction Disutrbances:nonspecific intraventricular conduction delay      Joya Gaskins, MD 08/22/11 2022

## 2011-08-22 NOTE — ED Notes (Signed)
Pt in via [facility] from home, pt c/o skin ulcer on buttocks & buttocks pain, pt has been immobile in bed x 41yr, pts family assist with pts care, pt lives with wife, pt A&O x4, follows commands, speaks in complete sentences, vitals stable in route

## 2011-08-22 NOTE — H&P (Signed)
[name] is an 77 y.o. male.   Chief Complaint: sob, cp HPI: 77 yo male with hx of CAD s/p CABG, Hypertension, Hyperlipidemia, Dm2, Gout apparently has "sore buttock" and "cp"    Pt states that every once in a while he has cp, sscp "achy", no radiation,  Rest appears to make it better.  Worse with exertion. Last just a few seconds,  + sob.  Denies fever, chills, cough, palp, n/v, brbpr, black stool.   Last stress test a couple of years ago per pt Last episode of cp , a few days ago.  Per pt slg nitro didn't help.   Soreness on bottom is "a rash".  Pt has failure to thrive according to the ED, and requires admission for this as well as w/up of cp  In ED, ekg=>unable to pull up on epic.  CXR negative for any acute  Process.  Initial set of cardiac markers negative.       Past Medical History  Diagnosis Date  . Gout   . Diabetes mellitus   . Hypertension   . Hyperlipidemia     Past Surgical History  Procedure Date  . Coronary artery bypass graft     Family History  Problem Relation Age of Onset  . Coronary artery disease Maternal Aunt    Social History:  reports that he quit smoking about 43 years ago. His smoking use included Cigarettes. He has a 25 pack-year smoking history. He does not have any smokeless tobacco history on file. His alcohol and drug histories not on file.  Allergies: No Known Allergies   (Not in a hospital admission)  Results for orders placed during the hospital encounter of 08/22/11 (from the past 48 hour(s))  CBC     Status: Abnormal   Collection Time   08/22/11  4:21 PM      Component Value Range Comment   WBC 7.7  4.0 - 10.5 K/uL    RBC 4.68  4.22 - 5.81 MIL/uL    Hemoglobin 13.2  13.0 - 17.0 g/dL    HCT 16.1  09.6 - 04.5 %    MCV 89.5  78.0 - 100.0 fL    MCH 28.2  26.0 - 34.0 pg    MCHC 31.5  30.0 - 36.0 g/dL    RDW 40.9 (*) 81.1 - 15.5 %    Platelets 172  150 - 400 K/uL   DIFFERENTIAL     Status: Abnormal   Collection Time   08/22/11   4:21 PM      Component Value Range Comment   Neutrophils Relative 81 (*) 43 - 77 %    Neutro Abs 6.3  1.7 - 7.7 K/uL    Lymphocytes Relative 8 (*) 12 - 46 %    Lymphs Abs 0.6 (*) 0.7 - 4.0 K/uL    Monocytes Relative 8  3 - 12 %    Monocytes Absolute 0.6  0.1 - 1.0 K/uL    Eosinophils Relative 3  0 - 5 %    Eosinophils Absolute 0.2  0.0 - 0.7 K/uL    Basophils Relative 0  0 - 1 %    Basophils Absolute 0.0  0.0 - 0.1 K/uL   COMPREHENSIVE METABOLIC PANEL     Status: Abnormal   Collection Time   08/22/11  4:21 PM      Component Value Range Comment   Sodium 137  135 - 145 mEq/L    Potassium 3.8  3.5 - 5.1  mEq/L    Chloride 104  96 - 112 mEq/L    CO2 23  19 - 32 mEq/L    Glucose, Bld 125 (*) 70 - 99 mg/dL    BUN 19  6 - 23 mg/dL    Creatinine, Ser 4.54  0.50 - 1.35 mg/dL    Calcium 9.3  8.4 - 09.8 mg/dL    Total Protein 7.5  6.0 - 8.3 g/dL    Albumin 3.2 (*) 3.5 - 5.2 g/dL    AST 20  0 - 37 U/L    ALT 17  0 - 53 U/L    Alkaline Phosphatase 122 (*) 39 - 117 U/L    Total Bilirubin 1.2  0.3 - 1.2 mg/dL    GFR calc non Af Amer 51 (*) >90 mL/min    GFR calc Af Amer 59 (*) >90 mL/min   PROTIME-INR     Status: Abnormal   Collection Time   08/22/11  4:21 PM      Component Value Range Comment   Prothrombin Time 31.5 (*) 11.6 - 15.2 seconds    INR 2.99 (*) 0.00 - 1.49   CK     Status: Normal   Collection Time   08/22/11  4:21 PM      Component Value Range Comment   Total CK 43  7 - 232 U/L   URINALYSIS, ROUTINE W REFLEX MICROSCOPIC     Status: Normal   Collection Time   08/22/11  4:53 PM      Component Value Range Comment   Color, Urine YELLOW  YELLOW    APPearance CLEAR  CLEAR    Specific Gravity, Urine 1.010  1.005 - 1.030    pH 6.5  5.0 - 8.0    Glucose, UA NEGATIVE  NEGATIVE mg/dL    Hgb urine dipstick NEGATIVE  NEGATIVE    Bilirubin Urine NEGATIVE  NEGATIVE    Ketones, ur NEGATIVE  NEGATIVE mg/dL    Protein, ur NEGATIVE  NEGATIVE mg/dL    Urobilinogen, UA 1.0  0.0 - 1.0 mg/dL     Nitrite NEGATIVE  NEGATIVE    Leukocytes, UA NEGATIVE  NEGATIVE MICROSCOPIC NOT DONE ON URINES WITH NEGATIVE PROTEIN, BLOOD, LEUKOCYTES, NITRITE, OR GLUCOSE <1000 mg/dL.  TROPONIN I     Status: Normal   Collection Time   08/22/11  5:52 PM      Component Value Range Comment   Troponin I <0.30  <0.30 ng/mL    No results found.  Review of Systems  Constitutional: Negative for fever, chills, weight loss, malaise/fatigue and diaphoresis.  HENT: Negative for hearing loss, ear pain, nosebleeds, congestion, neck pain, tinnitus and ear discharge.   Eyes: Negative for blurred vision, double vision, photophobia, pain, discharge and redness.  Respiratory: Negative for cough, hemoptysis, sputum production, shortness of breath, wheezing and stridor.   Cardiovascular: Positive for chest pain. Negative for palpitations, orthopnea, claudication, leg swelling and PND.  Gastrointestinal: Positive for heartburn. Negative for nausea, vomiting, abdominal pain, diarrhea, constipation, blood in stool and melena.  Genitourinary: Negative for dysuria, urgency, frequency, hematuria and flank pain.  Musculoskeletal: Negative for myalgias, back pain, joint pain and falls.  Skin: Negative for itching and rash.  Neurological: Negative for dizziness, tingling, tremors, sensory change, speech change, focal weakness, seizures, loss of consciousness, weakness and headaches.  Endo/Heme/Allergies: Negative for environmental allergies and polydipsia. Does not bruise/bleed easily.  Psychiatric/Behavioral: Negative for depression, suicidal ideas, hallucinations, memory loss and substance abuse. The patient is not nervous/anxious  and does not have insomnia.     Blood pressure 114/65, pulse 57, temperature 98.6 F (37 C), temperature source Oral, resp. rate 18, SpO2 95.00%. Physical Exam  Constitutional: He is oriented to person, place, and time. He appears well-developed and well-nourished. No distress.  HENT:  Head:  Normocephalic and atraumatic.  Mouth/Throat: No oropharyngeal exudate.  Eyes: Conjunctivae are normal. Pupils are equal, round, and reactive to light. Right eye exhibits no discharge. Left eye exhibits no discharge. No scleral icterus.  Neck: Normal range of motion. Neck supple. No JVD present. No tracheal deviation present. No thyromegaly present.  Cardiovascular: Normal rate and regular rhythm.  Exam reveals no gallop and no friction rub.   No murmur heard. Respiratory: Effort normal and breath sounds normal. No stridor. No respiratory distress. He has no wheezes. He has no rales. He exhibits no tenderness.  GI: Soft. Bowel sounds are normal. He exhibits no distension and no mass. There is no tenderness. There is no rebound and no guarding.  Musculoskeletal: Normal range of motion. He exhibits no edema and no tenderness.  Lymphadenopathy:    He has no cervical adenopathy.  Neurological: He is alert and oriented to person, place, and time. He has normal reflexes. He displays normal reflexes. No cranial nerve deficit. He exhibits normal muscle tone. Coordination normal.  Skin: Skin is warm and dry. No rash noted. He is not diaphoretic. No erythema. No pallor.       Midline scar, on chest from cabg extremely obses  Psychiatric: He has a normal mood and affect. His behavior is normal. Judgment and thought content normal.     Assessment/Plan Cp Tele Cycle cardiac markers Npo after mn Lipid  CAD s/p CABG: Cont asa, lipitor,  imdur, metoprolol  Dm2: fsbs q4h Iss  Gout Cont colchicine Cont febuxostat  Hypertension Continue present bp medication  ? Afib:  On coumadin???  (not sure why he is on this at this time)  Pearson Grippe 08/22/2011, 9:12 PM

## 2011-08-22 NOTE — ED Notes (Signed)
Provided pt with orange juice and crackers.

## 2011-08-22 NOTE — ED Notes (Signed)
Admitting MD at bedside.

## 2011-08-23 ENCOUNTER — Encounter (HOSPITAL_COMMUNITY): Payer: Self-pay | Admitting: Cardiology

## 2011-08-23 ENCOUNTER — Inpatient Hospital Stay (HOSPITAL_COMMUNITY): Payer: Medicare Other

## 2011-08-23 DIAGNOSIS — R079 Chest pain, unspecified: Secondary | ICD-10-CM

## 2011-08-23 DIAGNOSIS — E1165 Type 2 diabetes mellitus with hyperglycemia: Secondary | ICD-10-CM

## 2011-08-23 DIAGNOSIS — I25810 Atherosclerosis of coronary artery bypass graft(s) without angina pectoris: Secondary | ICD-10-CM

## 2011-08-23 DIAGNOSIS — L89109 Pressure ulcer of unspecified part of back, unspecified stage: Secondary | ICD-10-CM

## 2011-08-23 LAB — GLUCOSE, CAPILLARY
Glucose-Capillary: 51 mg/dL — ABNORMAL LOW (ref 70–99)
Glucose-Capillary: 76 mg/dL (ref 70–99)
Glucose-Capillary: 83 mg/dL (ref 70–99)
Glucose-Capillary: 95 mg/dL (ref 70–99)

## 2011-08-23 LAB — CBC
MCH: 28.7 pg (ref 26.0–34.0)
MCHC: 32.1 g/dL (ref 30.0–36.0)
MCV: 89.4 fL (ref 78.0–100.0)
Platelets: 175 10*3/uL (ref 150–400)
RBC: 4.43 MIL/uL (ref 4.22–5.81)
RDW: 18.2 % — ABNORMAL HIGH (ref 11.5–15.5)

## 2011-08-23 LAB — COMPREHENSIVE METABOLIC PANEL
ALT: 17 U/L (ref 0–53)
AST: 24 U/L (ref 0–37)
Albumin: 2.9 g/dL — ABNORMAL LOW (ref 3.5–5.2)
Alkaline Phosphatase: 108 U/L (ref 39–117)
CO2: 24 mEq/L (ref 19–32)
Chloride: 103 mEq/L (ref 96–112)
Creatinine, Ser: 1.27 mg/dL (ref 0.50–1.35)
GFR calc non Af Amer: 54 mL/min — ABNORMAL LOW (ref >90)
Potassium: 3.5 mEq/L (ref 3.5–5.1)
Sodium: 137 mEq/L (ref 135–145)
Total Bilirubin: 1.3 mg/dL — ABNORMAL HIGH (ref 0.3–1.2)

## 2011-08-23 LAB — CARDIAC PANEL(CRET KIN+CKTOT+MB+TROPI)
CK, MB: 1.8 ng/mL (ref 0.3–4.0)
Relative Index: INVALID (ref 0.0–2.5)
Total CK: 40 U/L (ref 7–232)
Troponin I: <0.3 ng/mL (ref <0.30)

## 2011-08-23 LAB — LIPID PANEL
Cholesterol: 85 mg/dL (ref 0–200)
HDL: 32 mg/dL — ABNORMAL LOW (ref >39)
Total CHOL/HDL Ratio: 2.7 RATIO
Triglycerides: 83 mg/dL (ref <150)

## 2011-08-23 LAB — DIFFERENTIAL
Basophils Relative: 1 % (ref 0–1)
Eosinophils Absolute: 0.2 10*3/uL (ref 0.0–0.7)
Eosinophils Relative: 3 % (ref 0–5)
Lymphs Abs: 0.8 10*3/uL (ref 0.7–4.0)
Neutrophils Relative %: 77 % (ref 43–77)

## 2011-08-23 LAB — TSH: TSH: 3.45 u[IU]/mL (ref 0.350–4.500)

## 2011-08-23 LAB — PRO B NATRIURETIC PEPTIDE: Pro B Natriuretic peptide (BNP): 3111 pg/mL — ABNORMAL HIGH (ref 0–125)

## 2011-08-23 LAB — HEMOGLOBIN A1C: Mean Plasma Glucose: 128 mg/dL — ABNORMAL HIGH (ref <117)

## 2011-08-23 MED ORDER — WARFARIN SODIUM 2.5 MG PO TABS
2.5000 mg | ORAL_TABLET | Freq: Every day | ORAL | Status: AC
  Administered 2011-08-23: 2.5 mg via ORAL
  Filled 2011-08-23: qty 1

## 2011-08-23 MED ORDER — DEXTROSE 50 % IV SOLN
INTRAVENOUS | Status: AC
  Administered 2011-08-23: 25 mL
  Filled 2011-08-23: qty 50

## 2011-08-23 MED ORDER — WARFARIN - PHARMACIST DOSING INPATIENT: Freq: Every day | Status: DC

## 2011-08-23 MED ORDER — DEXTROSE 50 % IV SOLN
25.0000 mL | Freq: Once | INTRAVENOUS | Status: AC
  Administered 2011-08-23: 25 mL via INTRAVENOUS
  Filled 2011-08-23: qty 50

## 2011-08-23 MED ORDER — INSULIN ASPART 100 UNIT/ML ~~LOC~~ SOLN
0.0000 [IU] | SUBCUTANEOUS | Status: DC
Start: 2011-08-24 — End: 2011-08-25

## 2011-08-23 MED ORDER — DEXTROSE 50 % IV SOLN
INTRAVENOUS | Status: AC
  Administered 2011-08-23: 25 mL via INTRAVENOUS
  Filled 2011-08-23: qty 50

## 2011-08-23 MED ORDER — DIPHENOXYLATE-ATROPINE 2.5-0.025 MG PO TABS
2.0000 | ORAL_TABLET | Freq: Once | ORAL | Status: AC
  Administered 2011-08-23: 2 via ORAL
  Filled 2011-08-23: qty 2

## 2011-08-23 NOTE — Progress Notes (Signed)
ANTICOAGULATION CONSULT NOTE - Initial Consult  Pharmacy Consult for coumadin Indication: atrial fibrillation  No Known Allergies  Patient Measurements: Height: 5\' 11"  (180.3 cm) Weight: 276 lb 14.4 oz (125.6 kg) IBW/kg (Calculated) : 75.3   Vital Signs: Temp: 98.4 F (36.9 C) (06/24 0500) Temp src: Oral (06/24 0500) BP: 117/72 mmHg (06/24 0500) Pulse Rate: 62  (06/24 0500)  Labs:  Alvira Philips 08/23/11 0336 08/22/11 2138 08/22/11 1752 08/22/11 1621  HGB 12.7* -- -- 13.2  HCT 39.6 -- -- 41.9  PLT 175 -- -- 172  APTT -- -- -- --  LABPROT -- -- -- 31.5*  INR -- -- -- 2.99*  HEPARINUNFRC -- -- -- --  CREATININE 1.27 -- -- 1.33  CKTOTAL 40 40 -- 43  CKMB 1.8 1.8 -- --  TROPONINI <0.30 <0.30 <0.30 --    Estimated Creatinine Clearance: 68.9 ml/min (by C-G formula based on Cr of 1.27).   Medical History: Past Medical History  Diagnosis Date  . Gout   . Diabetes mellitus   . Hypertension   . Hyperlipidemia   . Coronary artery disease     Medications:  Scheduled:    . amiodarone  200 mg Oral Daily  . aspirin  324 mg Oral Once  . aspirin EC  81 mg Oral Daily  . atorvastatin  40 mg Oral Daily  . colchicine  0.6 mg Oral BID  . dextrose  25 mL Intravenous Once  . dextrose      . diphenhydrAMINE  25 mg Intravenous Once  . febuxostat  80 mg Oral Daily  . furosemide  120 mg Oral Daily  . Gerhardt's butt cream  1 application Topical BID  . insulin aspart  0-9 Units Subcutaneous Q4H  . isosorbide mononitrate  60 mg Oral Daily  . latanoprost  1 drop Both Eyes QHS  . metoprolol tartrate  25 mg Oral BID  . potassium chloride SA  20 mEq Oral Daily  . sodium chloride  3 mL Intravenous Q12H  . DISCONTD: glimepiride  4 mg Oral QAC breakfast  . DISCONTD: insulin aspart  0-9 Units Subcutaneous TID WC    Assessment: 77 yo who was admitted for intermittent CP which has now resolved. He has been on chronic coumadin for afib. His admission INR was 2.99 on home dose. Coumadin has  been ordered to be resumed  Goal of Therapy:  INR 2-3 Monitor platelets by anticoagulation protocol: Yes   Plan:  1. Coumadin 2.5mg  qday 2. Daily PT/INR  Ulyses Southward [facility] 08/23/2011,8:24 AM

## 2011-08-23 NOTE — Consult Note (Signed)
Admit date: 08/22/2011 Referring Physician   Dr. Jomarie Longs Primary Cardiologist  Dr. Verdis Prime Reason for Consultation  Chest pain  UJW:JXBJ is a 77 yo male with hx of CAD s/p CABG, Ischemic DCM EF 25%, Hypertension, Hyperlipidemia, Dm2, chronic systolic CHF and stage 3 renal failure who presented to the ER with complaints of chest pain.   He states that every once in a while he has  sscp which he describes as a pressure sensation in his chest with no radiation.  Rest appears to make it better and is worsened by exertion. It lasts just a few seconds at a time.  He also complains of  Sob but has a history of SOB in the past. Denies fever, chills, cough, palp, n/v, brbpr, black stool. Last stress test a couple of years ago per pt.  He last saw Dr. Katrinka Blazing in January at which time he was not having any chest pain. Per pt slg nitro didn't help. We are now asked to consult for further management of chest pain.      PMH:   Past Medical History  Diagnosis Date  . Gout   . Diabetes mellitus   . Hypertension   . 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 Coronary artery disease s/p CABG      PSH:   Past Surgical History  Procedure Date  . Coronary artery bypass graft 1996    Allergies:  Review of patient's allergies indicates no known allergies. Prior to Admit Meds:   Prescriptions prior to admission  Medication Sig Dispense Refill  . amiodarone (PACERONE) 200 MG tablet Take 200 mg by mouth daily.      Marland Kitchen aspirin EC 81 MG tablet Take 81 mg by mouth daily.      Marland Kitchen atorvastatin (LIPITOR) 40 MG tablet Take 40 mg by mouth daily.      . clonazePAM (KLONOPIN) 0.5 MG tablet Take 0.5 mg by mouth at bedtime as needed. For anxiety      . colchicine 0.6 MG tablet Take 0.6 mg by mouth 2 (two) times  daily.      . Febuxostat (ULORIC) 80 MG TABS Take 1 tablet by mouth daily.      . furosemide (LASIX) 80 MG tablet Take 120 mg by mouth daily.      Marland Kitchen glimepiride (AMARYL) 4 MG tablet Take 4 mg by mouth daily before breakfast.      . Hydrocortisone (GERHARDT'S BUTT CREAM) CREA Apply 1 application topically 2 (two) times daily. For rash      . hydrocortisone cream 1 % Apply 1 application topically 2 (two) times daily. For itch      . isosorbide mononitrate (IMDUR) 60 MG 24 hr tablet Take 60 mg by mouth daily.      Marland Kitchen latanoprost (XALATAN) 0.005 % ophthalmic solution Place 1 drop into both eyes at bedtime.      . metoprolol tartrate (LOPRESSOR) 25 MG tablet Take 25 mg by mouth 2 (two) times daily.      . potassium chloride SA (K-DUR,KLOR-CON) 20 MEQ tablet Take 20 mEq by mouth daily.      Marland Kitchen warfarin (COUMADIN) 5 MG tablet Take 2.5 mg by mouth daily.       Fam HX:  Family History  Problem Relation Age of Onset  . Coronary artery disease Maternal Aunt    Social HX:    History   Social History  . Marital Status: Married    Spouse Name: N/A    Number of Children: N/A  . Years of Education: N/A   Occupational History  . custodial     retired   Social History Main Topics  . Smoking status: Former Smoker -- 1.0 packs/day for 25 years    Types: Cigarettes    Quit date: 03/01/1968  . Smokeless tobacco: Not on file  . Alcohol Use: Not on file  . Drug Use: Not on file  . Sexually Active: Not on file   Other Topics Concern  . Not on file   Social History Narrative  . No narrative on file     ROS:  All 11 ROS were addressed and are negative except what is stated in the HPI  Physical Exam:     General: Well developed, well nourished, in no acute distress Head: Eyes PERRLA, No xanthomas.   Normal cephalic and atramatic  Lungs:   Clear bilaterally to auscultation and percussion. Heart:   HRRR S1 S2 Pulses are 2+ & equal.            No carotid bruit. No JVD.  No abdominal bruits.  No femoral bruits. Abdomen: Bowel sounds are positive, abdomen soft and non-tender without masses Extremities:   No clubbing, cyanosis or edema.  DP +1 Neuro: Alert and oriented X 3. Psych:  Good affect, responds appropriately    Labs:   Lab Results  Component Value Date   WBC 7.2 08/23/2011   HGB 12.7* 08/23/2011   HCT 39.6 08/23/2011   MCV 89.4 08/23/2011   PLT 175 08/23/2011    Lab 08/23/11 0336  NA 137  K 3.5  CL 103  CO2 24  BUN 18  CREATININE 1.27  CALCIUM 9.0  PROT 7.1  BILITOT 1.3*  ALKPHOS 108  ALT 17  AST 24  GLUCOSE 104*   No results found for this basename: PTT   Lab Results  Component Value Date   INR 2.99* 08/22/2011   INR 2.23* 10/14/2009   INR 2.74* 12/26/2008   Lab Results  Component Value Date   CKTOTAL 40 08/23/2011   CKMB 1.8 08/23/2011   TROPONINI <0.30 08/23/2011     Lab Results  Component Value Date   CHOL 85 08/23/2011   CHOL  Value: 68        ATP III CLASSIFICATION:  <200     mg/dL   Desirable  478-295  mg/dL   Borderline High  >=621    mg/dL   High        05/06/6576   CHOL  Value: 91        ATP III CLASSIFICATION:  <200     mg/dL   Desirable  469-629  mg/dL   Borderline High  >=528    mg/dL   High 06/12/2438   Lab Results  Component Value Date   HDL 32* 08/23/2011   HDL 21* 05/23/2008   HDL 24* 07/28/2007   Lab Results  Component Value Date   LDLCALC 36 08/23/2011   LDLCALC  Value: 34        Total Cholesterol/HDL:CHD Risk Coronary Heart Disease Risk Table                     Men   Women  1/2 Average  Risk   3.4   3.3  Average Risk       5.0   4.4  2 X Average Risk   9.6   7.1  3 X Average Risk  23.4   11.0        Use the calculated Patient Ratio above and the CHD Risk Table to determine the patient's CHD Risk.        ATP III CLASSIFICATION (LDL):  <100     mg/dL   Optimal  784-696  mg/dL   Near or Above                    Optimal  130-159  mg/dL   Borderline  295-284  mg/dL   High  >132     mg/dL   Very High 4/40/1027   LDLCALC  Value: 46         Total Cholesterol/HDL:CHD Risk Coronary Heart Disease Risk Table                     Men   Women  1/2 Average Risk   3.4   3.3 07/28/2007   Lab Results  Component Value Date   TRIG 83 08/23/2011   TRIG 66 05/23/2008   TRIG 107 07/28/2007   Lab Results  Component Value Date   CHOLHDL 2.7 08/23/2011   CHOLHDL 3.2 05/23/2008   CHOLHDL 3.8 07/28/2007   No results found for this basename: LDLDIRECT      Radiology:  No results found.  EKG:    ASSESSMENT: 1. Chest pain with negative cardiac ezymes.  He is 15 years out from CABG and may be developing graft failure. 2.  CAD with history of CABG 3.  HTN 4.  Dyslipidemia 5.  DM 6.  PAF in NSR on amio 7.  Systemic anticoagulation with therapeutic INR 8.  CKD stage 3 9.  Ischemic cardiomyopathy EF 25%  PLAN:  1.  2 day Lexiscan Cardiolyte to rule out ischemia 2.  Consider EP eval for prophylatic AICD given CAD with low EF - will defer to Dr. Flossie Dibble, MD  08/23/2011  8:22 AM

## 2011-08-23 NOTE — Consult Note (Signed)
WOC consult Note Reason for Consult: Wound consult requested for buttocks.  This is not a pressure ulcer.  Pt has moisture acquired skin damage, and admits to staying wet when incontinent at home and also to sitting in a recliner for prolonged period of time.  Entire buttocks area macerated with grey, peeling skin and patchy areas of pink, dry partial thickness skin loss where pt states he has been scratching. Dr Garnette Scheuermann skin cream has already been ordered to be applied to affected area BID.  Bedside nurses can also apply antifungal powder PRN. Will not plan to follow further unless re-consulted.  [address], RN, MSN, [employer]  [phone]

## 2011-08-23 NOTE — Progress Notes (Signed)
CSW received referral for potential snf placement. However per discussion with Rn case manager and pt RN patient has been bed bound at home and is provided assistance by pt 11 children. Pt lives at home with spouse who has gout who is unable to provide physical care for pt. Per discussion with Rn case manager, pt plans to return home and is refusing home health services. However Pt asked Rn case manager to speak with pt wife. Pt is currently observation status. Please reconsult csw if placement is needed or other concerns arise.   Catha Gosselin, Theresia Majors  [phone] .08/23/2011 1625pm

## 2011-08-23 NOTE — Progress Notes (Signed)
UR Completed Kevonte Vanecek Graves-Bigelow, RN,BSN [phone]  

## 2011-08-23 NOTE — Care Management Note (Signed)
    Page 1 of 2   08/25/2011     11:53:52 AM   CARE MANAGEMENT NOTE 08/25/2011  Patient:  [name], [name]   Account Number:  [id number]  Date Initiated:  08/23/2011  Documentation initiated by:  GRAVES-BIGELOW,BRENDA  Subjective/Objective Assessment:   Pt admittd with cp/ sore bottom. Pt is from home with wife. Per family pt has hospital bed and that family provides care. CM offerred to have pt be seen by PT befroe d/c and he refused at this time. Pt stated he was to swollen BLE.     Action/Plan:   CM will continue to monitor for disposition needs.   Anticipated DC Date:  08/24/2011   Anticipated DC Plan:  HOME W HOME HEALTH SERVICES      DC Planning Services  CM consult      [facility] Choice  HOME HEALTH   Choice offered to / List presented to:  C-1 Patient        HH arranged  HH-1 RN  HH-2 PT      [facility] agency  [facility]   Status of service:  Completed, signed off Medicare Important Message given?   (If response is "NO", the following Medicare IM given date fields will be blank) Date Medicare IM given:   Date Additional Medicare IM given:    Discharge Disposition:  HOME W HOME HEALTH SERVICES  Per UR Regulation:  Reviewed for med. necessity/level of care/duration of stay  If discussed at Long Length of Stay Meetings, dates discussed:    Comments:  08/25/11- 1150- Donn Pierini RN, BSN [phone] Pt for possible d/c today, orders for HH-RN/PT- spoke with pt and family at bedside regarding HH- family thinks it would be a good idea and pt is agreeable- pt and family state that they have used [facility] in the past and want to use them again for services. Referral sent to [facility] via [facility] and call made to [facility] with [facility] regarding referral. [facility] services to begin within 24-48 hrs post discharge. No other d/c needs indentified with pt and family- pt to return home with family.

## 2011-08-23 NOTE — Progress Notes (Signed)
Subjective: No further chest pain, today Very atypical CP x2 days off and off, poor historian  Objective: Vital signs in last 24 hours: Temp:  [98 F (36.7 C)-98.7 F (37.1 C)] 98.4 F (36.9 C) (06/24 0500) Pulse Rate:  [57-68] 62  (06/24 0500) Resp:  [15-22] 18  (06/24 0500) BP: (102-124)/(65-82) 117/72 mmHg (06/24 0500) SpO2:  [94 %-100 %] 98 % (06/24 0500) Weight:  [125.6 kg (276 lb 14.4 oz)] 125.6 kg (276 lb 14.4 oz) (06/23 2317) Weight change:     Intake/Output from previous day:     Physical Exam: General: Alert, awake, obese, oriented x3, in no acute distress. HEENT: No bruits, no goiter. Heart: Regular rate and rhythm, without murmurs, rubs, gallops. Lungs: distant breath sounds. Abdomen: Soft, nontender, nondistended, positive bowel sounds. Sacrum: skin tears noted Extremities: No clubbing cyanosis or edema with positive pedal pulses.  Neuro: Grossly intact, nonfocal.    Lab Results: Basic Metabolic Panel:  Basename 08/23/11 0336 08/22/11 1621  NA 137 137  K 3.5 3.8  CL 103 104  CO2 24 23  GLUCOSE 104* 125*  BUN 18 19  CREATININE 1.27 1.33  CALCIUM 9.0 9.3  MG -- --  PHOS -- --   Liver Function Tests:  [facility] 08/23/11 0336 08/22/11 1621  AST 24 20  ALT 17 17  ALKPHOS 108 122*  BILITOT 1.3* 1.2  PROT 7.1 7.5  ALBUMIN 2.9* 3.2*   No results found for this basename: LIPASE:2,AMYLASE:2 in the last 72 hours No results found for this basename: AMMONIA:2 in the last 72 hours CBC:  Basename 08/23/11 0336 08/22/11 1621  WBC 7.2 7.7  NEUTROABS 5.5 6.3  HGB 12.7* 13.2  HCT 39.6 41.9  MCV 89.4 89.5  PLT 175 172   Cardiac Enzymes:  Basename 08/23/11 0336 08/22/11 2138 08/22/11 1752 08/22/11 1621  CKTOTAL 40 40 -- 43  CKMB 1.8 1.8 -- --  CKMBINDEX -- -- -- --  TROPONINI <0.30 <0.30 <0.30 --   BNP: No results found for this basename: PROBNP:3 in the last 72 hours D-Dimer: No results found for this basename: DDIMER:2 in the last 72  hours CBG:  Basename 08/23/11 0355 08/22/11 2313 08/22/11 2206  GLUCAP 51* 81 60*   Hemoglobin A1C: No results found for this basename: HGBA1C in the last 72 hours Fasting Lipid Panel:  Basename 08/23/11 0336  CHOL 85  HDL 32*  LDLCALC 36  TRIG 83  CHOLHDL 2.7  LDLDIRECT --   Thyroid Function Tests: No results found for this basename: TSH,T4TOTAL,FREET4,T3FREE,THYROIDAB in the last 72 hours Anemia Panel: No results found for this basename: VITAMINB12,FOLATE,FERRITIN,TIBC,IRON,RETICCTPCT in the last 72 hours Coagulation:  Basename 08/22/11 1621  LABPROT 31.5*  INR 2.99*   Urine Drug Screen: Drugs of Abuse  No results found for this basename: labopia, cocainscrnur, labbenz, amphetmu, thcu, labbarb    Alcohol Level: No results found for this basename: ETH:2 in the last 72 hours Urinalysis:  Basename 08/22/11 1653  COLORURINE YELLOW  LABSPEC 1.010  PHURINE 6.5  GLUCOSEU NEGATIVE  HGBUR NEGATIVE  BILIRUBINUR NEGATIVE  KETONESUR NEGATIVE  PROTEINUR NEGATIVE  UROBILINOGEN 1.0  NITRITE NEGATIVE  LEUKOCYTESUR NEGATIVE    No results found for this or any previous visit (from the past 240 hour(s)).  Studies/Results: No results found.  Medications: Scheduled Meds:   . amiodarone  200 mg Oral Daily  . aspirin  324 mg Oral Once  . aspirin EC  81 mg Oral Daily  . atorvastatin  40 mg Oral  Daily  . colchicine  0.6 mg Oral BID  . dextrose  25 mL Intravenous Once  . dextrose      . diphenhydrAMINE  25 mg Intravenous Once  . febuxostat  80 mg Oral Daily  . furosemide  120 mg Oral Daily  . Gerhardt's butt cream  1 application Topical BID  . insulin aspart  0-9 Units Subcutaneous Q4H  . isosorbide mononitrate  60 mg Oral Daily  . latanoprost  1 drop Both Eyes QHS  . metoprolol tartrate  25 mg Oral BID  . potassium chloride SA  20 mEq Oral Daily  . sodium chloride  3 mL Intravenous Q12H  . DISCONTD: glimepiride  4 mg Oral QAC breakfast  . DISCONTD: insulin aspart   0-9 Units Subcutaneous TID WC   Continuous Infusions:   . DISCONTD: sodium chloride     PRN Meds:.clonazePAM  Assessment/Plan: 1. Atypical chest pain-intermittent, now resolved EKG, cardiac enzymes x3 negative H/o CAD/CABG cardiomyopathy EF of 25% (5/09) Will check ECHO, chest Xray Continue ASA, Metoprolol, Imdur, statin, lasix 120mg  daily Request Dr.Smith's input 2. DM/hypoglycemia: Hold glipizide, SSI for now 3. Stage sacral decub: would care consult 4. Obesity/Bed bound status: Pt eval 5. HTN: stable 6. H/o Aflutter/afib on Amiodarone/coumadin Disposition: pending cards eval    LOS: 1 day   [facility] Pager: [phone] 08/23/2011, 7:48 AM

## 2011-08-23 NOTE — Progress Notes (Signed)
CBG: 51  Treatment: D50 IV 25 mL  Symptoms: Hungry  Follow-up CBG: Time: 0435 CBG Result: 84  Possible Reasons for Event: Inadequate meal intake  Comments/MD notified: Elray Mcgregor notified.  Orders received.    [name]

## 2011-08-24 DIAGNOSIS — R079 Chest pain, unspecified: Secondary | ICD-10-CM

## 2011-08-24 DIAGNOSIS — E1165 Type 2 diabetes mellitus with hyperglycemia: Secondary | ICD-10-CM

## 2011-08-24 DIAGNOSIS — I25810 Atherosclerosis of coronary artery bypass graft(s) without angina pectoris: Secondary | ICD-10-CM

## 2011-08-24 DIAGNOSIS — R197 Diarrhea, unspecified: Secondary | ICD-10-CM

## 2011-08-24 LAB — GLUCOSE, CAPILLARY
Glucose-Capillary: 54 mg/dL — ABNORMAL LOW (ref 70–99)
Glucose-Capillary: 80 mg/dL (ref 70–99)

## 2011-08-24 LAB — CLOSTRIDIUM DIFFICILE BY PCR: Toxigenic C. Difficile by PCR: NEGATIVE

## 2011-08-24 MED ORDER — LOPERAMIDE HCL 2 MG PO CAPS
2.0000 mg | ORAL_CAPSULE | Freq: Two times a day (BID) | ORAL | Status: DC | PRN
  Administered 2011-08-25 (×2): 2 mg via ORAL
  Filled 2011-08-24 (×2): qty 1

## 2011-08-24 MED ORDER — DEXTROSE 50 % IV SOLN
INTRAVENOUS | Status: AC
  Administered 2011-08-24: 25 mL
  Filled 2011-08-24: qty 50

## 2011-08-24 MED ORDER — GLUCOSE-VITAMIN C 4-6 GM-MG PO CHEW
CHEWABLE_TABLET | ORAL | Status: AC
  Administered 2011-08-24: 3
  Filled 2011-08-24: qty 1

## 2011-08-24 MED ORDER — TECHNETIUM TC 99M TETROFOSMIN IV KIT
30.0000 | PACK | Freq: Once | INTRAVENOUS | Status: AC | PRN
  Administered 2011-08-24: 30 via INTRAVENOUS

## 2011-08-24 MED ORDER — HYDRALAZINE HCL 10 MG PO TABS
10.0000 mg | ORAL_TABLET | Freq: Three times a day (TID) | ORAL | Status: DC
  Administered 2011-08-24 – 2011-08-25 (×3): 10 mg via ORAL
  Filled 2011-08-24 (×5): qty 1

## 2011-08-24 MED ORDER — TECHNETIUM TC 99M TETROFOSMIN IV KIT
30.0000 | PACK | Freq: Once | INTRAVENOUS | Status: AC | PRN
  Administered 2011-08-23: 30 via INTRAVENOUS

## 2011-08-24 MED ORDER — REGADENOSON 0.4 MG/5ML IV SOLN
0.4000 mg | Freq: Once | INTRAVENOUS | Status: AC
  Administered 2011-08-24: 0.4 mg via INTRAVENOUS
  Filled 2011-08-24: qty 5

## 2011-08-24 MED ORDER — WARFARIN SODIUM 1 MG PO TABS
1.0000 mg | ORAL_TABLET | Freq: Once | ORAL | Status: AC
  Administered 2011-08-24: 1 mg via ORAL
  Filled 2011-08-24: qty 1

## 2011-08-24 MED ORDER — REGADENOSON 0.4 MG/5ML IV SOLN
INTRAVENOUS | Status: AC
  Administered 2011-08-24: 0.4 mg via INTRAVENOUS
  Filled 2011-08-24: qty 5

## 2011-08-24 NOTE — Progress Notes (Signed)
Pharmacist Heart Failure Core Measure Documentation  Assessment: [name] has an EF documented as 25% on 06/2007 by ECHO.  Rationale: Heart failure patients with left ventricular systolic dysfunction (LVSD) and an EF < 40% should be prescribed an angiotensin converting enzyme inhibitor (ACEI) or angiotensin receptor blocker (ARB) at discharge unless a contraindication is documented in the medical record.  This patient is not currently on an ACEI or ARB for HF.  This note is being placed in the record in order to provide documentation that a contraindication to the use of these agents is present for this encounter.  ACE Inhibitor or Angiotensin Receptor Blocker is contraindicated (specify all that apply)  []   ACEI allergy AND ARB allergy []   Angioedema []   Moderate or severe aortic stenosis []   Hyperkalemia []   Hypotension []   Renal artery stenosis [x]   Worsening renal function, preexisting renal disease or dysfunction   [name] [facility] 08/24/2011 8:40 AM

## 2011-08-24 NOTE — Progress Notes (Signed)
  Echocardiogram 2D Echocardiogram has been performed.  Cathie Beams 08/24/2011, 3:29 PM

## 2011-08-24 NOTE — Progress Notes (Signed)
CBG: 54  Treatment: 15 GM carbohydrate snack         3 Glucose tablets         1/2 Amp of D50  Symptoms: None  Follow-up CBG: Time:0415 CBG Result:60      Time:0450   CBG Result:64      Time:0540   CBG Result:124  Possible Reasons for Event: Inadequate meal intake and Unknown  Comments/MD notified:     Cherlyn Cushing

## 2011-08-24 NOTE — Progress Notes (Signed)
Subjective: No further chest pain, diarrhea x3-4 times yesterday  Objective: Vital signs in last 24 hours: Temp:  [97.3 F (36.3 C)-98.5 F (36.9 C)] 97.3 F (36.3 C) (06/25 1430) Pulse Rate:  [57-68] 60  (06/25 1430) Resp:  [18-20] 20  (06/25 1430) BP: (102-120)/(59-76) 107/59 mmHg (06/25 1430) SpO2:  [97 %-100 %] 97 % (06/25 1430) Weight change:  Last BM Date: 08/23/11  Intake/Output from previous day: 06/24 0701 - 06/25 0700 In: 480 [P.O.:480] Out: 953 [Urine:950; Stool:3]   Physical Exam: General: Alert, awake, obese, oriented x3, in no acute distress. HEENT: No bruits, no goiter. Heart: Regular rate and rhythm, without murmurs, rubs, gallops. Lungs: distant breath sounds. Abdomen: Soft, nontender, nondistended, positive bowel sounds. Sacrum: skin tears noted Extremities: No clubbing cyanosis or edema with positive pedal pulses.  Neuro: Grossly intact, nonfocal.    Lab Results: Basic Metabolic Panel:  Basename 08/23/11 0336 08/22/11 1621  NA 137 137  K 3.5 3.8  CL 103 104  CO2 24 23  GLUCOSE 104* 125*  BUN 18 19  CREATININE 1.27 1.33  CALCIUM 9.0 9.3  MG -- --  PHOS -- --   Liver Function Tests:  [facility] 08/23/11 0336 08/22/11 1621  AST 24 20  ALT 17 17  ALKPHOS 108 122*  BILITOT 1.3* 1.2  PROT 7.1 7.5  ALBUMIN 2.9* 3.2*   No results found for this basename: LIPASE:2,AMYLASE:2 in the last 72 hours No results found for this basename: AMMONIA:2 in the last 72 hours CBC:  Basename 08/23/11 0336 08/22/11 1621  WBC 7.2 7.7  NEUTROABS 5.5 6.3  HGB 12.7* 13.2  HCT 39.6 41.9  MCV 89.4 89.5  PLT 175 172   Cardiac Enzymes:  Basename 08/23/11 1007 08/23/11 0336 08/22/11 2138  CKTOTAL 42 40 40  CKMB 1.7 1.8 1.8  CKMBINDEX -- -- --  TROPONINI <0.30 <0.30 <0.30   BNP:  Basename 08/23/11 1007  PROBNP 3111.0*   D-Dimer: No results found for this basename: DDIMER:2 in the last 72 hours CBG:  Basename 08/24/11 1707 08/24/11 1105 08/24/11 0736  08/24/11 0547 08/24/11 0454 08/24/11 0418  GLUCAP 104* 76 80 124* 64* 60*   Hemoglobin A1C:  Basename 08/22/11 2138  HGBA1C 6.1*   Fasting Lipid Panel:  Basename 08/23/11 0336  CHOL 85  HDL 32*  LDLCALC 36  TRIG 83  CHOLHDL 2.7  LDLDIRECT --   Thyroid Function Tests:  Basename 08/23/11 0336  TSH 3.450  T4TOTAL --  FREET4 --  T3FREE --  THYROIDAB --   Anemia Panel: No results found for this basename: VITAMINB12,FOLATE,FERRITIN,TIBC,IRON,RETICCTPCT in the last 72 hours Coagulation:  Basename 08/24/11 0500 08/22/11 1621  LABPROT 32.4* 31.5*  INR 3.10* 2.99*   Urine Drug Screen: Drugs of Abuse  No results found for this basename: labopia,  cocainscrnur,  labbenz,  amphetmu,  thcu,  labbarb    Alcohol Level: No results found for this basename: ETH:2 in the last 72 hours Urinalysis:  Basename 08/22/11 1653  COLORURINE YELLOW  LABSPEC 1.010  PHURINE 6.5  GLUCOSEU NEGATIVE  HGBUR NEGATIVE  BILIRUBINUR NEGATIVE  KETONESUR NEGATIVE  PROTEINUR NEGATIVE  UROBILINOGEN 1.0  NITRITE NEGATIVE  LEUKOCYTESUR NEGATIVE    Recent Results (from the past 240 hour(s))  CLOSTRIDIUM DIFFICILE BY PCR     Status: Normal   Collection Time   08/24/11  1:05 PM      Component Value Range Status Comment   C difficile by pcr NEGATIVE  NEGATIVE Final  Studies/Results: Nm Myocar Multi W/spect W/wall Motion / Ef  08/24/2011  *RADIOLOGY REPORT*  Clinical Data:  Chest pain, CAD  MYOCARDIAL IMAGING WITH SPECT (REST AND PHARMACOLOGIC-STRESS - 2 DAY PROTOCOL) GATED LEFT VENTRICULAR WALL MOTION STUDY LEFT VENTRICULAR EJECTION FRACTION  Technique:  Standard myocardial SPECT imaging was performed after intravenous injection of 30 mCi Tc-94m Myoview at rest.  On a different day, intravenous infusion of  lexiscan was performed under supervision of the Cardiology staff.  At peak effect of the drug, 30 mCi Tc-23m Myoview was injected intravenously and standard myocardial SPECT imaging was  performed.  Quantitative gated imaging was also performed to evaluate left ventricular wall motion and estimate left ventricular ejection fraction.  Comparison:  None.  Findings: The stress and rest SPECT images demonstrate a dilated left ventricle with a fixed apical defect extending into the anteroseptal and inferolateral walls.  The gated stress SPECT images demonstrate global hypokinesis with focal dyskinesis in the apex.  Calculated left ventricular end-diastolic volume 272 mL, end- systolic volume 221 mL, ejection fraction of 19%.  IMPRESSION: 1. Negative for pharmacologic-stress induced ischemia.  2. Fixed apical defect extending into the anteroseptal and inferolateral walls, suggesting scarring from prior infarct.  3. Dilated left ventricle with global hypokinesis and apical dyskinesis.  4. Decreased left ventricular ejection fraction of 19%.  Original Report Authenticated By: Charline Bills, M.D.   Dg Chest Port 1 View  08/23/2011  *RADIOLOGY REPORT*  Clinical Data: Shortness of breath and CHF.  PORTABLE CHEST - 1 VIEW  Comparison: 12/19/2008  Findings: Single view of the chest was obtained.  The cardiac silhouette is enlarged and similar to the prior examination. Postsurgical changes consistent with a CABG.  Prominent interstitial markings with some peribronchial thickening.  Findings may represent interstitial edema.  No focal airspace disease. Patient is mildly rotated on this examination.  IMPRESSION: Cardiomegaly with peribronchial thickening.  Findings may represent interstitial edema.  Original Report Authenticated By: Richarda Overlie, M.D.    Medications: Scheduled Meds:    . amiodarone  200 mg Oral Daily  . aspirin EC  81 mg Oral Daily  . atorvastatin  40 mg Oral Daily  . colchicine  0.6 mg Oral BID  . dextrose      . diphenoxylate-atropine  2 tablet Oral Once  . febuxostat  80 mg Oral Daily  . furosemide  120 mg Oral Daily  . Gerhardt's butt cream  1 application Topical BID  .  glucose-Vitamin C      . insulin aspart  0-9 Units Subcutaneous Q4H  . isosorbide mononitrate  60 mg Oral Daily  . latanoprost  1 drop Both Eyes QHS  . metoprolol tartrate  25 mg Oral BID  . potassium chloride SA  20 mEq Oral Daily  . regadenoson  0.4 mg Intravenous Once  . sodium chloride  3 mL Intravenous Q12H  . warfarin  1 mg Oral ONCE-1800  . Warfarin - Pharmacist Dosing Inpatient   Does not apply q1800  . DISCONTD: insulin aspart  0-9 Units Subcutaneous Q4H   Continuous Infusions:  PRN Meds:.clonazePAM, loperamide, technetium tetrofosmin  Assessment/Plan: 1. Atypical chest pain-intermittent, now resolved EKG, cardiac enzymes x3 negative H/o CAD/CABG cardiomyopathy EF of 25% (5/09) Day 2 of stress test Continue ASA, Metoprolol, Imdur, statin, lasix 120mg  daily Await input from Dr.Smith, after stress test,  2. DM/hypoglycemia: Hold glipizide, SSI for now 3. Skin damage from moisture on Buttocks per wound RN 4. Obesity/Bed bound status: Pt eval 5. HTN: stable 6.  H/o Aflutter/afib on Amiodarone/coumadin Disposition: pending cards eval    LOS: 2 days   [facility] Pager: [phone] 08/24/2011, 7:19 PM

## 2011-08-24 NOTE — Progress Notes (Signed)
Please see Dr. Anne Fu' note. I would be reluctant to pursue invasive evaluation due to high risk of complications. We will see how abnormal the nuclear study is. He would probably not be a repeat CABG candidate.

## 2011-08-24 NOTE — Progress Notes (Signed)
ANTICOAGULATION CONSULT NOTE - Initial Consult  Pharmacy Consult for coumadin Indication: atrial fibrillation  No Known Allergies  Patient Measurements: Height: 5\' 11"  (180.3 cm) Weight: 276 lb 14.4 oz (125.6 kg) IBW/kg (Calculated) : 75.3   Vital Signs: Temp: 98.5 F (36.9 C) (06/24 2100) Temp src: Oral (06/24 2100) BP: 110/68 mmHg (06/24 2100) Pulse Rate: 57  (06/24 2100)  Labs:  Basename 08/24/11 0500 08/23/11 1007 08/23/11 0336 08/22/11 2138 08/22/11 1621  HGB -- -- 12.7* -- 13.2  HCT -- -- 39.6 -- 41.9  PLT -- -- 175 -- 172  APTT -- -- -- -- --  LABPROT 32.4* -- -- -- 31.5*  INR 3.10* -- -- -- 2.99*  HEPARINUNFRC -- -- -- -- --  CREATININE -- -- 1.27 -- 1.33  CKTOTAL -- 42 40 40 --  CKMB -- 1.7 1.8 1.8 --  TROPONINI -- <0.30 <0.30 <0.30 --    Estimated Creatinine Clearance: 68.9 ml/min (by C-G formula based on Cr of 1.27).   Medical History: Past Medical History  Diagnosis Date  . Gout   . Diabetes mellitus   . Hypertension   . Hyperlipidemia   . Coronary artery disease     Medications:  Scheduled:     . amiodarone  200 mg Oral Daily  . aspirin EC  81 mg Oral Daily  . atorvastatin  40 mg Oral Daily  . colchicine  0.6 mg Oral BID  . dextrose      . diphenoxylate-atropine  2 tablet Oral Once  . febuxostat  80 mg Oral Daily  . furosemide  120 mg Oral Daily  . Gerhardt's butt cream  1 application Topical BID  . glucose-Vitamin C      . insulin aspart  0-9 Units Subcutaneous Q4H  . isosorbide mononitrate  60 mg Oral Daily  . latanoprost  1 drop Both Eyes QHS  . metoprolol tartrate  25 mg Oral BID  . potassium chloride SA  20 mEq Oral Daily  . sodium chloride  3 mL Intravenous Q12H  . warfarin  2.5 mg Oral q1800  . Warfarin - Pharmacist Dosing Inpatient   Does not apply q1800  . DISCONTD: insulin aspart  0-9 Units Subcutaneous Q4H    Assessment: 77 yo who was admitted for intermittent CP which has now resolved. He has been on chronic coumadin  for afib. INR = 3.1 today.   Goal of Therapy:  INR 2-3 Monitor platelets by anticoagulation protocol: Yes   Plan:  1. Coumadin 1 mg PO x1 2. Daily PT/INR  Ulyses Southward [place] 08/24/2011,8:33 AM

## 2011-08-24 NOTE — Progress Notes (Addendum)
Subjective:  No CP or SOB overnight. OK night. Episodes at home brief, CP, while laying down. Non exertional .  Objective:  Vital Signs in the last 24 hours: Temp:  [98.2 F (36.8 C)-98.5 F (36.9 C)] 98.5 F (36.9 C) (06/24 2100) Pulse Rate:  [57-59] 57  (06/24 2100) Resp:  [18] 18  (06/24 2100) BP: (105-128)/(64-77) 110/68 mmHg (06/24 2100) SpO2:  [95 %-100 %] 100 % (06/24 2100)  Intake/Output from previous day: 06/24 0701 - 06/25 0700 In: 480 [P.O.:480] Out: 953 [Urine:950; Stool:3]   Physical Exam: General: Well developed, well nourished, in no acute distress. Head:  Normocephalic and atraumatic. Lungs: Clear to auscultation and percussion. Heart: Normal S1 and S2.  No murmur, rubs or gallops.  Abdomen: soft, non-tender, positive bowel sounds. Extremities: No clubbing or cyanosis. No edema. Neurologic: Alert and oriented x 3.    Lab Results:  Basename 08/23/11 0336 08/22/11 1621  WBC 7.2 7.7  HGB 12.7* 13.2  PLT 175 172    Basename 08/23/11 0336 08/22/11 1621  NA 137 137  K 3.5 3.8  CL 103 104  CO2 24 23  GLUCOSE 104* 125*  BUN 18 19  CREATININE 1.27 1.33    Basename 08/23/11 1007 08/23/11 0336  TROPONINI <0.30 <0.30   Hepatic Function Panel  Basename 08/23/11 0336  PROT 7.1  ALBUMIN 2.9*  AST 24  ALT 17  ALKPHOS 108  BILITOT 1.3*  BILIDIR --  IBILI --    Basename 08/23/11 0336  CHOL 85   No results found for this basename: PROTIME in the last 72 hours  Imaging: Dg Chest Port 1 View  08/23/2011  *RADIOLOGY REPORT*  Clinical Data: Shortness of breath and CHF.  PORTABLE CHEST - 1 VIEW  Comparison: 12/19/2008  Findings: Single view of the chest was obtained.  The cardiac silhouette is enlarged and similar to the prior examination. Postsurgical changes consistent with a CABG.  Prominent interstitial markings with some peribronchial thickening.  Findings may represent interstitial edema.  No focal airspace disease. Patient is mildly rotated on  this examination.  IMPRESSION: Cardiomegaly with peribronchial thickening.  Findings may represent interstitial edema.  Original Report Authenticated By: Richarda Overlie, M.D.   Personally viewed.    Cardiac Studies:  Await NUC images  Assessment/Plan:   77 year old cardiomyopathy EF 25% - No dyspnea. Compensated. No changes in meds.   Chest pain - no further CP. Await NUC.  AFIB - none on tele in chart. Amiodarone. INR theraputic. Chronic anticoagulation.  CAD - post CABG Yeast infection - per primary team.  ?PT - mostly sedentary  Dr. Katrinka Blazing came back and spoke with him in NUC stress area.      [name] 08/24/2011, 9:05 AM

## 2011-08-25 DIAGNOSIS — I2510 Atherosclerotic heart disease of native coronary artery without angina pectoris: Secondary | ICD-10-CM

## 2011-08-25 DIAGNOSIS — D649 Anemia, unspecified: Secondary | ICD-10-CM

## 2011-08-25 DIAGNOSIS — I43 Cardiomyopathy in diseases classified elsewhere: Secondary | ICD-10-CM

## 2011-08-25 DIAGNOSIS — R079 Chest pain, unspecified: Secondary | ICD-10-CM

## 2011-08-25 LAB — PROTIME-INR
INR: 3.18 — ABNORMAL HIGH (ref 0.00–1.49)
Prothrombin Time: 33.1 seconds — ABNORMAL HIGH (ref 11.6–15.2)

## 2011-08-25 LAB — GLUCOSE, CAPILLARY

## 2011-08-25 MED ORDER — COLCHICINE 0.6 MG PO TABS: 0.3000 mg | ORAL_TABLET | Freq: Every day | ORAL | Status: DC

## 2011-08-25 MED ORDER — HYDRALAZINE HCL 10 MG PO TABS: 10.0000 mg | ORAL_TABLET | Freq: Three times a day (TID) | ORAL | Status: DC

## 2011-08-25 MED ORDER — WARFARIN SODIUM 5 MG PO TABS: 2.5000 mg | ORAL_TABLET | Freq: Every day | ORAL | Status: DC

## 2011-08-25 NOTE — Progress Notes (Signed)
.  Clinical social worker assisted with patient discharge to return home. .Patient transportation provided by [employer] [facility] as patient is bed bound. .No further Clinical Social Work needs, signing off.   Catha Gosselin, LCSWA  [phone] 08/25/2011 1439pm

## 2011-08-25 NOTE — Discharge Instructions (Signed)
HH RN/PT arranged with [facility]- [phone]

## 2011-08-25 NOTE — Progress Notes (Signed)
D/c instructions reviewed with pt and his daughter. Both verbalized understanding of all instructions. Education provided, CHF education discussed with pt/family and when to call MD. Both verbalized understanding of these instructions. Copy of instructions given to pt. Scripts were sent to pt's pharm. Pt waiting for ambulance transportation to arrive.

## 2011-08-25 NOTE — Progress Notes (Signed)
ANTICOAGULATION CONSULT NOTE - Initial Consult  Pharmacy Consult for coumadin Indication: atrial fibrillation  No Known Allergies  Patient Measurements: Height: 5\' 11"  (180.3 cm) Weight: 271 lb 13.2 oz (123.3 kg) IBW/kg (Calculated) : 75.3   Vital Signs: Temp: 98.6 F (37 C) (06/26 0500) BP: 131/83 mmHg (06/26 0500) Pulse Rate: 58  (06/26 0500)  Labs:  Alvira Philips 08/25/11 0625 08/24/11 0500 08/23/11 1007 08/23/11 0336 08/22/11 2138 08/22/11 1621  HGB -- -- -- 12.7* -- 13.2  HCT -- -- -- 39.6 -- 41.9  PLT -- -- -- 175 -- 172  APTT -- -- -- -- -- --  LABPROT 33.1* 32.4* -- -- -- 31.5*  INR 3.18* 3.10* -- -- -- 2.99*  HEPARINUNFRC -- -- -- -- -- --  CREATININE -- -- -- 1.27 -- 1.33  CKTOTAL -- -- 42 40 40 --  CKMB -- -- 1.7 1.8 1.8 --  TROPONINI -- -- <0.30 <0.30 <0.30 --    Estimated Creatinine Clearance: 68.2 ml/min (by C-G formula based on Cr of 1.27).   Medical History: Past Medical History  Diagnosis Date  . Gout   . Diabetes mellitus   . Hypertension   . Hyperlipidemia   . Coronary artery disease     Medications:  Scheduled:     . amiodarone  200 mg Oral Daily  . aspirin EC  81 mg Oral Daily  . atorvastatin  40 mg Oral Daily  . colchicine  0.6 mg Oral BID  . febuxostat  80 mg Oral Daily  . furosemide  120 mg Oral Daily  . Gerhardt's butt cream  1 application Topical BID  . hydrALAZINE  10 mg Oral Q8H  . insulin aspart  0-9 Units Subcutaneous Q4H  . isosorbide mononitrate  60 mg Oral Daily  . latanoprost  1 drop Both Eyes QHS  . metoprolol tartrate  25 mg Oral BID  . potassium chloride SA  20 mEq Oral Daily  . regadenoson  0.4 mg Intravenous Once  . sodium chloride  3 mL Intravenous Q12H  . warfarin  1 mg Oral ONCE-1800  . Warfarin - Pharmacist Dosing Inpatient   Does not apply q1800    Assessment: 77 yo who was admitted for intermittent CP which has now resolved. He has been on chronic coumadin for afib. INR = 3.18 today. No bleeding  Goal of  Therapy:  INR 2-3 Monitor platelets by anticoagulation protocol: Yes   Plan:  1. No coumadin today 2. Daily PT/INR  Ulyses Southward [place] 08/25/2011,8:46 AM

## 2011-08-25 NOTE — Progress Notes (Addendum)
Patient Name: [name] Date of Encounter: 08/25/2011    SUBJECTIVE: Has no angina and denies dyspnea. Says the chest pain is not different than usual. No chest pain and denies NTG use. We discussed AICD in setting of EF 19%. He is not interested in advanced heart failure devices.  TELEMETRY:  NSR: Filed Vitals:   08/24/11 0907 08/24/11 1430 08/24/11 2100 08/25/11 0500  BP: 102/63 107/59 104/64 131/83  Pulse: 67 60 53 58  Temp:  97.3 F (36.3 C) 98 F (36.7 C) 98.6 F (37 C)  TempSrc:  Oral    Resp:  20 20 18   Height:      Weight:    123.3 kg (271 lb 13.2 oz)  SpO2:  97% 93% 96%   No intake or output data in the 24 hours ending 08/25/11 1323  LABS: Basic Metabolic Panel:  Basename 08/23/11 0336 08/22/11 1621  NA 137 137  K 3.5 3.8  CL 103 104  CO2 24 23  GLUCOSE 104* 125*  BUN 18 19  CREATININE 1.27 1.33  CALCIUM 9.0 9.3  MG -- --  PHOS -- --   CBC:  Basename 08/23/11 0336 08/22/11 1621  WBC 7.2 7.7  NEUTROABS 5.5 6.3  HGB 12.7* 13.2  HCT 39.6 41.9  MCV 89.4 89.5  PLT 175 172   Cardiac Enzymes:  Basename 08/23/11 1007 08/23/11 0336 08/22/11 2138  CKTOTAL 42 40 40  CKMB 1.7 1.8 1.8  CKMBINDEX -- -- --  TROPONINI <0.30 <0.30 <0.30   Hemoglobin A1C:  Basename 08/22/11 2138  HGBA1C 6.1*   Fasting Lipid Panel:  Basename 08/23/11 0336  CHOL 85  HDL 32*  LDLCALC 36  TRIG 83  CHOLHDL 2.7  LDLDIRECT --    Radiology/Studies:  Nuclear study  IMPRESSION:  1. Negative for pharmacologic-stress induced ischemia.  2. Fixed apical defect extending into the anteroseptal and  inferolateral walls, suggesting scarring from prior infarct.  3. Dilated left ventricle with global hypokinesis and apical  dyskinesis.  4. Decreased left ventricular ejection fraction of 19%.  Original Report Authenticated By: Charline Bills, M.D.     Physical Exam: Blood pressure 131/83, pulse 58, temperature 98.6 F (37 C), temperature source Oral, resp. rate 18,  height 5\' 11"  (1.803 m), weight 123.3 kg (271 lb 13.2 oz), SpO2 96.00%. Weight change:    Chest is clear. S3 gallop.  ASSESSMENT:  1. Ischemic cardiomyopathy, with stable CHF symptoms due to patient being bedridden  2. Angina, stable  3. Early sacral decubitus   Plan:  1. No AICD or further ischemic evaluation at patients request.  Signed, Lesleigh Noe 08/25/2011, 1:23 PM

## 2011-08-25 NOTE — Discharge Summary (Addendum)
Discharge Summary  LUBY SEAMANS MR#: [id number]  DOB:1935/02/16  Date of Admission: 08/22/2011 Date of Discharge: 08/25/2011  Patient's PCP: No primary provider on file.  Attending Physician:Lurene Robley  Consults: 1. Cardiology: Lesleigh Noe, MD 2. Wound care nursing 3. Physical therapy and occupational therapy.   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 Patient apparently is mostly bedbound and has sore on his buttocks for some time.   Discharge Medications Current Discharge Medication List    START taking these medications   Details  hydrALAZINE (APRESOLINE) 10 MG tablet Take 1 tablet (10 mg total) by mouth every 8 (eight) hours. Qty: 90 tablet, Refills: 0      CONTINUE these medications which have CHANGED   Details  warfarin (COUMADIN) 5 MG tablet Take 0.5 tablets (2.5 mg total) by mouth daily. DO NOT TAKE COUMADIN UNTIL ADVISED BY DR. [facility].      CONTINUE these medications which have NOT CHANGED   Details  amiodarone (PACERONE) 200 MG tablet Take 200 mg by mouth daily.    aspirin EC 81 MG tablet Take 81 mg by mouth daily.    atorvastatin (LIPITOR) 40 MG tablet Take 40 mg by mouth daily.    clonazePAM (KLONOPIN) 0.5 MG tablet Take 0.5 mg by mouth at bedtime as needed. For anxiety    colchicine 0.6 MG tablet Take 0.6 mg by mouth 2 (two) times daily.    Febuxostat (ULORIC) 80 MG TABS Take 1 tablet by mouth daily.    furosemide (LASIX) 80 MG tablet Take 120 mg by mouth daily.    Hydrocortisone (GERHARDT'S BUTT CREAM) CREA Apply 1 application topically 2 (two) times daily. For rash    hydrocortisone cream 1 % Apply 1 application topically 2 (two) times daily. For itch    isosorbide mononitrate (IMDUR) 60 MG  24 hr tablet Take 60 mg by mouth daily.    latanoprost (XALATAN) 0.005 % ophthalmic solution Place 1 drop into both eyes at bedtime.    metoprolol tartrate (LOPRESSOR) 25 MG tablet Take 25 mg by mouth 2 (two) times daily.    potassium chloride SA (K-DUR,KLOR-CON) 20 MEQ tablet Take 20 mEq by mouth daily.      STOP taking these medications     glimepiride (AMARYL) 4 MG tablet Comments:  Reason for Stopping:          Hospital Course: <principal problem not specified> Present on Admission:  **None**   1. Chest pain: Admitted to telemetry without arrhythmia alarms. EKG and the cycle cardiac enzymes: Negative. Cardiology was consulted. 2-D echo showed LVEF of 10-15% with diffuse LV hypokinesis. Nuclear stress test was negative for pharmacologic-stress-induced ischemia. Cardiology has seen the patient today and recommend no further workup and have cleared him for discharge home. Chest pain has resolved. 2. Cardiomyopathy with EF 10-15%: Dr. Katrinka Blazing discussed option of placement of AICD but the patient declined. Dr. Katrinka Blazing has cleared patient for discharge home. 3. Atrial fibrillation: Telemetry shows sinus rhythm with first-degree heart block. Patient is slightly coagulopathic. His Coumadin will be temporarily held for today and tomorrow. He will have repeat INR checked through the home health RN on 6/28 and Coumadin dose can be adjusted appropriately. This has been discussed with Dr. Katrinka Blazing. Continue amiodarone and beta  blockers. 4. Coronary artery disease, status post CABG: Management as above. Continue aspirin and medical regimen. 5. Type 2 diabetes mellitus: Patient had hypoglycemic episodes and his oral hypoglycemic agents were discontinued. Since then his blood sugar controls without medications are reasonable. Monitor without medications. 6. Sacral macerated skin: Secondary to pressure from lying on the back: Patient is advised to change positions frequently and keep the area clean, dry and  intact. The same was discussed with patient's daughter Ms. [employer]. 7. Hypertension: Controlled. 8. Deconditioning and failure to thrive: Multifactorial. 9. Diarrhea: May have been secondary to colchicine. Patient indicates that he takes half a tablet daily, at home. Currently he is getting 1 tablet twice a day as per med rec-we'll change on home and reck at discharge to what he was taking before. C. difficile PCR: Negative. Improved.  Day of Discharge  Complaints: No chest pain or dyspnea. Diarrhea has improved. Says no BM since last night.  Physical exam: BP 131/83  Pulse 58  Temp 98.6 F (37 C) (Oral)  Resp 18  Ht 5\' 11"  (1.803 m)  Wt 123.3 kg (271 lb 13.2 oz)  BMI 37.91 kg/m2  SpO2 96% General exam: Comfortable. Respiratory system: Clear. No increased work of breathing. Cardiovascular system: First and second heart sounds heard, regular. No JVD or murmurs or pedal edema. Telemetry shows sinus rhythm with first degree heart block with heart rate in the 60s. Gastrointestinal system: Abdomen is nondistended, soft and nontender and normal bowel sounds heard. Central nervous system: Alert and oriented. No focal neurological deficits. Extremities: Bilateral lower extremities grade 3 x 5 power at least- Cannot assess objectively secondary to lack of patient cooperation. Upper extremities symmetrical grade 5 x 5 power. Skin: Sacral skin is macerated with one or 2 superficial cuts with some dried blood.  Basic Metabolic Panel:  Lab 08/23/11 1610 08/22/11 1621  NA 137 137  K 3.5 3.8  CL 103 104  CO2 24 23  GLUCOSE 104* 125*  BUN 18 19  CREATININE 1.27 1.33  CALCIUM 9.0 9.3  ALB -- --  PHOS -- --   Liver Function Tests:  Lab 08/23/11 0336 08/22/11 1621  AST 24 20  ALT 17 17  ALKPHOS 108 122*  BILITOT 1.3* 1.2  PROT 7.1 7.5  ALBUMIN 2.9* 3.2*   No results found for this basename: LIPASE:3,AMYLASE:3 in the last 168 hours No results found for this basename:  AMMONIA:3 in the last 168 hours CBC:  Lab 08/23/11 0336 08/22/11 1621  WBC 7.2 7.7  NEUTROABS 5.5 6.3  HGB 12.7* 13.2  HCT 39.6 41.9  MCV 89.4 89.5  PLT 175 172   Cardiac Enzymes:  Lab 08/23/11 1007 08/23/11 0336 08/22/11 2138 08/22/11 1752 08/22/11 1621  CKTOTAL 42 40 40 -- 43  CKMB 1.7 1.8 1.8 -- --  CKMBINDEX -- -- -- -- --  TROPONINI <0.30 <0.30 <0.30 <0.30 --   CBG:  Lab 08/25/11 1133 08/25/11 0739 08/25/11 0432 08/24/11 2031 08/24/11 1707  GLUCAP 85 77 76 134* 104*   Other lab data:  1. ProBNP: 3111. 2. Lipid panel: Significant for HDL of 32. Otherwise within normal limits. 3. INR: 3.18. 4. TSH: 3.450. 5. Hemoglobin A1c: 6.1. 6. Urine analysis: Not indicated of urinary tract infection. 7. Stools: Negative for C. difficile PCR.  Nm Myocar Multi W/spect W/wall Motion / Ef  08/24/2011  *RADIOLOGY REPORT*  Clinical Data:  Chest pain, CAD  MYOCARDIAL IMAGING WITH SPECT (REST AND PHARMACOLOGIC-STRESS - 2 DAY PROTOCOL) GATED  LEFT VENTRICULAR WALL MOTION STUDY LEFT VENTRICULAR EJECTION FRACTION  Technique:  Standard myocardial SPECT imaging was performed after intravenous injection of 30 mCi Tc-39m Myoview at rest.  On a different day, intravenous infusion of  lexiscan was performed under supervision of the Cardiology staff.  At peak effect of the drug, 30 mCi Tc-53m Myoview was injected intravenously and standard myocardial SPECT imaging was performed.  Quantitative gated imaging was also performed to evaluate left ventricular wall motion and estimate left ventricular ejection fraction.  Comparison:  None.  Findings: The stress and rest SPECT images demonstrate a dilated left ventricle with a fixed apical defect extending into the anteroseptal and inferolateral walls.  The gated stress SPECT images demonstrate global hypokinesis with focal dyskinesis in the apex.  Calculated left ventricular end-diastolic volume 272 mL, end- systolic volume 221 mL, ejection fraction of 19%.   IMPRESSION: 1. Negative for pharmacologic-stress induced ischemia.  2. Fixed apical defect extending into the anteroseptal and inferolateral walls, suggesting scarring from prior infarct.  3. Dilated left ventricle with global hypokinesis and apical dyskinesis.  4. Decreased left ventricular ejection fraction of 19%.  Original Report Authenticated By: Charline Bills, M.D.   Dg Chest Port 1 View  08/23/2011  *RADIOLOGY REPORT*  Clinical Data: Shortness of breath and CHF.  PORTABLE CHEST - 1 VIEW  Comparison: 12/19/2008  Findings: Single view of the chest was obtained.  The cardiac silhouette is enlarged and similar to the prior examination. Postsurgical changes consistent with a CABG.  Prominent interstitial markings with some peribronchial thickening.  Findings may represent interstitial edema.  No focal airspace disease. Patient is mildly rotated on this examination.  IMPRESSION: Cardiomegaly with peribronchial thickening.  Findings may represent interstitial edema.  Original Report Authenticated By: Richarda Overlie, M.D.   2-D echocardiogram: Study Conclusions  - Left ventricle: The cavity size was severely dilated. Systolic function was severely reduced. The estimated ejection fraction was in the range of 10% to 15%. Diffuse hypokinesis. Doppler parameters are consistent with a reversible restrictive pattern, indicative of decreased left ventricular diastolic compliance and/or increased left atrial pressure (grade 3 diastolic dysfunction). - Mitral valve: Mild regurgitation. - Left atrium: The atrium was moderately dilated. - Right ventricle: The cavity size was moderately dilated. Systolic function was mildly reduced. - Right atrium: The atrium was mildly dilated. - Pulmonary arteries: Systolic pressure was moderately increased. PA peak pressure: 53mm Hg (S).      Disposition: Discharged home in stable condition.  Diet: Diabetic.  Activity: Increase activity gradually.  Follow-up  Appts: Discharge Orders    Future Orders Please Complete By Expires   Diet - low sodium heart healthy      Diet Carb Modified      Diet Carb Modified      Increase activity slowly      Call MD for:  temperature >100.4      Call MD for:  redness, tenderness, or signs of infection (pain, swelling, redness, odor or green/yellow discharge around incision site)      Call MD for:  severe uncontrolled pain      Discharge wound care:      Comments:   Change position frequently and try to keep buttocks area clean, dry and intact.      TESTS THAT NEED FOLLOW-UP PT and INR on 08/27/11 which will be drawn by home health RN and results are to be forwarded to Dr. Verdis Prime for Coumadin dose adjustment.  Time spent on discharge, talking to the patient,  and coordinating care: Greater than 30 minutes   Signed: Marcellus Scott, MD 08/25/2011, 1:06 PM

## 2011-08-25 NOTE — Progress Notes (Signed)
Utilization review completed.  

## 2011-09-29 ENCOUNTER — Other Ambulatory Visit (HOSPITAL_COMMUNITY): Payer: Self-pay | Admitting: Internal Medicine

## 2012-06-27 ENCOUNTER — Emergency Department (HOSPITAL_COMMUNITY)
Admission: EM | Admit: 2012-06-27 | Discharge: 2012-06-27 | Disposition: A | Discharge status: Home / Self Care | Payer: Medicare Other | Source: Home / Self Care | Attending: Emergency Medicine | Admitting: Emergency Medicine

## 2012-06-27 ENCOUNTER — Emergency Department (HOSPITAL_COMMUNITY): Payer: Medicare Other

## 2012-06-27 ENCOUNTER — Encounter (HOSPITAL_COMMUNITY): Payer: Self-pay | Admitting: *Deleted

## 2012-06-27 DIAGNOSIS — R059 Cough, unspecified: Secondary | ICD-10-CM | POA: Insufficient documentation

## 2012-06-27 DIAGNOSIS — I2510 Atherosclerotic heart disease of native coronary artery without angina pectoris: Secondary | ICD-10-CM | POA: Insufficient documentation

## 2012-06-27 DIAGNOSIS — M109 Gout, unspecified: Secondary | ICD-10-CM | POA: Insufficient documentation

## 2012-06-27 DIAGNOSIS — Z87891 Personal history of nicotine dependence: Secondary | ICD-10-CM | POA: Insufficient documentation

## 2012-06-27 DIAGNOSIS — Z7901 Long term (current) use of anticoagulants: Secondary | ICD-10-CM | POA: Insufficient documentation

## 2012-06-27 DIAGNOSIS — I509 Heart failure, unspecified: Secondary | ICD-10-CM

## 2012-06-27 DIAGNOSIS — Z79899 Other long term (current) drug therapy: Secondary | ICD-10-CM | POA: Insufficient documentation

## 2012-06-27 DIAGNOSIS — E785 Hyperlipidemia, unspecified: Secondary | ICD-10-CM | POA: Insufficient documentation

## 2012-06-27 DIAGNOSIS — R05 Cough: Secondary | ICD-10-CM | POA: Insufficient documentation

## 2012-06-27 DIAGNOSIS — I10 Essential (primary) hypertension: Secondary | ICD-10-CM | POA: Insufficient documentation

## 2012-06-27 DIAGNOSIS — Z7982 Long term (current) use of aspirin: Secondary | ICD-10-CM | POA: Insufficient documentation

## 2012-06-27 DIAGNOSIS — Z951 Presence of aortocoronary bypass graft: Secondary | ICD-10-CM | POA: Insufficient documentation

## 2012-06-27 DIAGNOSIS — E119 Type 2 diabetes mellitus without complications: Secondary | ICD-10-CM | POA: Insufficient documentation

## 2012-06-27 DIAGNOSIS — H9209 Otalgia, unspecified ear: Secondary | ICD-10-CM | POA: Insufficient documentation

## 2012-06-27 LAB — COMPREHENSIVE METABOLIC PANEL
ALT: 20 U/L (ref 0–53)
AST: 27 U/L (ref 0–37)
Albumin: 3.3 g/dL — ABNORMAL LOW (ref 3.5–5.2)
Alkaline Phosphatase: 125 U/L — ABNORMAL HIGH (ref 39–117)
Chloride: 100 mEq/L (ref 96–112)
Potassium: 3.4 mEq/L — ABNORMAL LOW (ref 3.5–5.1)
Sodium: 137 mEq/L (ref 135–145)
Total Bilirubin: 1.1 mg/dL (ref 0.3–1.2)
Total Protein: 8.5 g/dL — ABNORMAL HIGH (ref 6.0–8.3)

## 2012-06-27 LAB — CBC
HCT: 43.7 % (ref 39.0–52.0)
Hemoglobin: 14.4 g/dL (ref 13.0–17.0)
Platelets: 174 10*3/uL (ref 150–400)
RBC: 5.03 MIL/uL (ref 4.22–5.81)
RDW: 18.4 % — ABNORMAL HIGH (ref 11.5–15.5)

## 2012-06-27 LAB — TROPONIN I: Troponin I: <0.3 ng/mL (ref <0.30)

## 2012-06-27 MED ORDER — BENZONATATE 100 MG PO CAPS: 100.0000 mg | ORAL_CAPSULE | Freq: Three times a day (TID) | ORAL | Status: DC | PRN

## 2012-06-27 MED ORDER — FUROSEMIDE 10 MG/ML IJ SOLN: 40.0000 mg | Freq: Once | INTRAMUSCULAR | Status: DC

## 2012-06-27 MED ORDER — FUROSEMIDE 20 MG PO TABS
80.0000 mg | ORAL_TABLET | Freq: Once | ORAL | Status: AC
  Administered 2012-06-27: 80 mg via ORAL
  Filled 2012-06-27: qty 4

## 2012-06-27 MED ORDER — FUROSEMIDE 10 MG/ML IJ SOLN: 80.0000 mg | Freq: Once | INTRAMUSCULAR | Status: DC

## 2012-06-27 MED ORDER — POTASSIUM CHLORIDE CRYS ER 20 MEQ PO TBCR
40.0000 meq | EXTENDED_RELEASE_TABLET | Freq: Once | ORAL | Status: AC
  Administered 2012-06-27: 40 meq via ORAL
  Filled 2012-06-27: qty 2

## 2012-06-27 NOTE — ED Notes (Signed)
Spoke with [name] in lab re: add on BNP & Troponin, labs have been added on, awaiting results

## 2012-06-27 NOTE — ED Provider Notes (Addendum)
History     CSN: [id number]  Arrival date & time 06/27/12  1040   First MD Initiated Contact with Patient 06/27/12 1108      Chief Complaint  Patient presents with  . Cough    (Consider location/radiation/quality/duration/timing/severity/associated sxs/prior treatment) Patient is a 78 y.o. male presenting with cough. The history is provided by the patient and a relative.  Cough Associated symptoms: no chest pain, no chills, no fever, no headaches, no rash and no shortness of breath   pt from home w c/o cough, occasionally productive of clear/yellowish phlegm for the past week. Cough is episodic without specific exacerbating or alleviating factors. Pt denies sob.  Had ear ache and scratchy throat. Throat is feeling better but cough persists. No trouble swallowing.  No sob. No chest pain or discomfort. Pt states is eating ok, denies problems w choking when eating or drinking. No fever or chills. No known ill contacts. Denies any recent change in meds or new meds. No leg pain or swelling. Denies orthopnea, no pnd.    Past Medical History  Diagnosis Date  . Gout   . Diabetes mellitus   . Hypertension   . Hyperlipidemia   . Coronary artery disease     Past Surgical History  Procedure Laterality Date  . Coronary artery bypass graft      Family History  Problem Relation Age of Onset  . Coronary artery disease Maternal Aunt     History  Substance Use Topics  . Smoking status: Former Smoker -- 1.00 packs/day for 25 years    Types: Cigarettes    Quit date: 03/01/1968  . Smokeless tobacco: Not on file  . Alcohol Use: Not on file      Review of Systems  Constitutional: Negative for fever and chills.  HENT: Negative for neck pain.   Eyes: Negative for redness.  Respiratory: Positive for cough. Negative for shortness of breath.   Cardiovascular: Negative for chest pain.  Gastrointestinal: Negative for abdominal pain.  Genitourinary: Negative for flank pain.   Musculoskeletal: Negative for back pain.  Skin: Negative for rash.  Neurological: Negative for headaches.  Hematological: Does not bruise/bleed easily.  Psychiatric/Behavioral: Negative for confusion.    Allergies  Review of patient's allergies indicates no known allergies.  Home Medications   Current Outpatient Rx  Name  Route  Sig  Dispense  Refill  . amiodarone (PACERONE) 200 MG tablet   Oral   Take 200 mg by mouth daily.         Marland Kitchen aspirin EC 81 MG tablet   Oral   Take 81 mg by mouth daily.         Marland Kitchen atorvastatin (LIPITOR) 40 MG tablet   Oral   Take 40 mg by mouth daily.         . clonazePAM (KLONOPIN) 0.5 MG tablet   Oral   Take 0.5 mg by mouth at bedtime as needed. For anxiety         . colchicine 0.6 MG tablet   Oral   Take 0.5 tablets (0.3 mg total) by mouth daily.         . Febuxostat (ULORIC) 80 MG TABS   Oral   Take 1 tablet by mouth daily.         . furosemide (LASIX) 80 MG tablet   Oral   Take 120 mg by mouth daily.         . hydrALAZINE (APRESOLINE) 10 MG tablet   Oral  Take 1 tablet (10 mg total) by mouth every 8 (eight) hours.   90 tablet   0   . Hydrocortisone (GERHARDT'S BUTT CREAM) CREA   Topical   Apply 1 application topically 2 (two) times daily. For rash         . hydrocortisone cream 1 %   Topical   Apply 1 application topically 2 (two) times daily. For itch         . isosorbide mononitrate (IMDUR) 60 MG 24 hr tablet   Oral   Take 60 mg by mouth daily.         Marland Kitchen latanoprost (XALATAN) 0.005 % ophthalmic solution   Both Eyes   Place 1 drop into both eyes at bedtime.         . metoprolol tartrate (LOPRESSOR) 25 MG tablet   Oral   Take 25 mg by mouth 2 (two) times daily.         . potassium chloride SA (K-DUR,KLOR-CON) 20 MEQ tablet   Oral   Take 20 mEq by mouth daily.         Marland Kitchen warfarin (COUMADIN) 5 MG tablet   Oral   Take 0.5 tablets (2.5 mg total) by mouth daily. DO NOT TAKE COUMADIN UNTIL  ADVISED BY DR. [facility].           BP 123/73  Pulse 66  Temp(Src) 98.7 F (37.1 C) (Oral)  Resp 16  SpO2 95%  Physical Exam  Nursing note and vitals reviewed. Constitutional: He is oriented to person, place, and time. He appears well-developed and well-nourished. No distress.  HENT:  Nose: Nose normal.  Mouth/Throat: Oropharynx is clear and moist.  eac w small amt cerumen. No acute om.    Eyes: Conjunctivae are normal.  Neck: Normal range of motion. Neck supple. No tracheal deviation present.  Cardiovascular: Normal rate, regular rhythm, normal heart sounds and intact distal pulses.   Pulmonary/Chest: Effort normal. No accessory muscle usage. No respiratory distress.  Non productive cough, mild upper resp congestion. Few basilar rales  Abdominal: Soft. Bowel sounds are normal. He exhibits no distension. There is no tenderness.  Musculoskeletal: Normal range of motion. He exhibits no edema and no tenderness.  Neurological: He is alert and oriented to person, place, and time.  Skin: Skin is warm and dry.  Psychiatric: He has a normal mood and affect.    ED Course  Procedures (including critical care time)   Results for orders placed during the hospital encounter of 06/27/12  CBC      Result Value Range   WBC 7.9  4.0 - 10.5 K/uL   RBC 5.03  4.22 - 5.81 MIL/uL   Hemoglobin 14.4  13.0 - 17.0 g/dL   HCT 40.9  81.1 - 91.4 %   MCV 86.9  78.0 - 100.0 fL   MCH 28.6  26.0 - 34.0 pg   MCHC 33.0  30.0 - 36.0 g/dL   RDW 78.2 (*) 95.6 - 21.3 %   Platelets 174  150 - 400 K/uL  COMPREHENSIVE METABOLIC PANEL      Result Value Range   Sodium 137  135 - 145 mEq/L   Potassium 3.4 (*) 3.5 - 5.1 mEq/L   Chloride 100  96 - 112 mEq/L   CO2 27  19 - 32 mEq/L   Glucose, Bld 150 (*) 70 - 99 mg/dL   BUN 22  6 - 23 mg/dL   Creatinine, Ser 0.86 (*) 0.50 - 1.35 mg/dL  Calcium 9.6  8.4 - 10.5 mg/dL   Total Protein 8.5 (*) 6.0 - 8.3 g/dL   Albumin 3.3 (*) 3.5 - 5.2 g/dL   AST 27  0 -  37 U/L   ALT 20  0 - 53 U/L   Alkaline Phosphatase 125 (*) 39 - 117 U/L   Total Bilirubin 1.1  0.3 - 1.2 mg/dL   GFR calc non Af Amer 47 (*) >90 mL/min   GFR calc Af Amer 55 (*) >90 mL/min  PROTIME-INR      Result Value Range   Prothrombin Time 25.8 (*) 11.6 - 15.2 seconds   INR 2.50 (*) 0.00 - 1.49  PRO B NATRIURETIC PEPTIDE      Result Value Range   Pro B Natriuretic peptide (BNP) 3605.0 (*) 0 - 450 pg/mL  TROPONIN I      Result Value Range   Troponin I <0.30  <0.30 ng/mL   Dg Chest 2 View  06/27/2012  *RADIOLOGY REPORT*  Clinical Data: Cough, shortness of breath.  CHEST - 2 VIEW  Comparison: 08/23/2011  Findings: Prior CABG.  Cardiomegaly with vascular congestion. Interstitial prominence may reflect mild interstitial edema.  No confluent opacities or effusions.  IMPRESSION: Cardiomegaly vascular congestion and possible mild interstitial edema.   Original Report Authenticated By: Charlett Nose, M.D.       MDM  Labs. Cxr.  Reviewed nursing notes and prior charts for additional history.   bnp elevated/chf. Will give lasix 80 iv.   Pt refuses iv meds and/or admission.  Discussed labs, elevated bnp, chf contributing to cough, etc, and recommendation for admit for lasix, med adjustment, observation.    Pt lying flat throughout ed stay, no increased dyspnea.   Again approached pt re admission/iv meds.  Pt declines. He states he feels he is breathing at baseline, and refuses iv meds/admission. Pt states will take po med but then wants to be d/cd to home.  Lasix po.  kcl po.   Pt refusing admit, will instruct to f/u pcp tomorrow. Take one extra dose lasix today.    Date: 06/27/2012  Rate: 60  Rhythm: normal sinus rhythm  QRS Axis: normal  Intervals: PR prolonged  ST/T Wave abnormalities: nonspecific ST/T changes  Conduction Disutrbances:first-degree A-V block  and nonspecific intraventricular conduction delay  Narrative Interpretation:   Old EKG Reviewed:  unchanged  Pt/family request rx for tessalon for home.      Suzi Roots, MD 06/27/12 1420  Suzi Roots, MD 06/27/12 1452

## 2012-06-27 NOTE — ED Notes (Signed)
PATIENT RECEIVED TO AWAIT PTR TO TRANSPORT HIM BACK TO THE NURSING HOME. FAMILY IS WAITING AT BEDSIDE WITH PT

## 2012-06-27 NOTE — ED Notes (Signed)
CALLED PTR AND SPOKE WITH RICK. HE ADVISES THEY ARE ENROUTE NOW

## 2012-06-27 NOTE — ED Notes (Signed)
Pt in from home via [name], EMS reports being called out for pt having SOB, upon arrival to home EMS states the pt was in no respiratory distress, RR & O2 sat WNL on RA, pt denies fever & chills, family concerned of risks of aspiration, pt A&O x4, follows commands, speaks in complete sentences, per report the pt does have a non productive cough

## 2012-06-30 ENCOUNTER — Emergency Department (HOSPITAL_COMMUNITY): Payer: Medicare Other

## 2012-06-30 ENCOUNTER — Inpatient Hospital Stay (HOSPITAL_COMMUNITY)
Admission: EM | Admit: 2012-06-30 | Discharge: 2012-07-07 | DRG: 292 | Disposition: A | Discharge status: Home Health | Payer: Medicare Other | Attending: Interventional Cardiology | Admitting: Interventional Cardiology

## 2012-06-30 ENCOUNTER — Encounter (HOSPITAL_COMMUNITY): Payer: Self-pay

## 2012-06-30 DIAGNOSIS — N179 Acute kidney failure, unspecified: Secondary | ICD-10-CM | POA: Diagnosis not present

## 2012-06-30 DIAGNOSIS — I480 Paroxysmal atrial fibrillation: Secondary | ICD-10-CM | POA: Diagnosis not present

## 2012-06-30 DIAGNOSIS — N183 Chronic kidney disease, stage 3 unspecified: Secondary | ICD-10-CM | POA: Diagnosis present

## 2012-06-30 DIAGNOSIS — I4891 Unspecified atrial fibrillation: Secondary | ICD-10-CM | POA: Diagnosis present

## 2012-06-30 DIAGNOSIS — Z8249 Family history of ischemic heart disease and other diseases of the circulatory system: Secondary | ICD-10-CM

## 2012-06-30 DIAGNOSIS — I5023 Acute on chronic systolic (congestive) heart failure: Principal | ICD-10-CM | POA: Diagnosis present

## 2012-06-30 DIAGNOSIS — I5022 Chronic systolic (congestive) heart failure: Secondary | ICD-10-CM | POA: Diagnosis present

## 2012-06-30 DIAGNOSIS — E876 Hypokalemia: Secondary | ICD-10-CM | POA: Diagnosis not present

## 2012-06-30 DIAGNOSIS — Z7901 Long term (current) use of anticoagulants: Secondary | ICD-10-CM

## 2012-06-30 DIAGNOSIS — I509 Heart failure, unspecified: Secondary | ICD-10-CM | POA: Diagnosis present

## 2012-06-30 DIAGNOSIS — R079 Chest pain, unspecified: Secondary | ICD-10-CM

## 2012-06-30 DIAGNOSIS — I669 Occlusion and stenosis of unspecified cerebral artery: Secondary | ICD-10-CM | POA: Diagnosis not present

## 2012-06-30 DIAGNOSIS — Z66 Do not resuscitate: Secondary | ICD-10-CM | POA: Diagnosis present

## 2012-06-30 DIAGNOSIS — E785 Hyperlipidemia, unspecified: Secondary | ICD-10-CM | POA: Diagnosis present

## 2012-06-30 DIAGNOSIS — E119 Type 2 diabetes mellitus without complications: Secondary | ICD-10-CM | POA: Diagnosis present

## 2012-06-30 DIAGNOSIS — I2510 Atherosclerotic heart disease of native coronary artery without angina pectoris: Secondary | ICD-10-CM | POA: Diagnosis present

## 2012-06-30 DIAGNOSIS — Z87891 Personal history of nicotine dependence: Secondary | ICD-10-CM

## 2012-06-30 DIAGNOSIS — Z951 Presence of aortocoronary bypass graft: Secondary | ICD-10-CM

## 2012-06-30 DIAGNOSIS — Z7401 Bed confinement status: Secondary | ICD-10-CM

## 2012-06-30 DIAGNOSIS — Z6836 Body mass index (BMI) 36.0-36.9, adult: Secondary | ICD-10-CM

## 2012-06-30 DIAGNOSIS — I259 Chronic ischemic heart disease, unspecified: Secondary | ICD-10-CM | POA: Diagnosis present

## 2012-06-30 DIAGNOSIS — M109 Gout, unspecified: Secondary | ICD-10-CM | POA: Diagnosis not present

## 2012-06-30 DIAGNOSIS — I129 Hypertensive chronic kidney disease with stage 1 through stage 4 chronic kidney disease, or unspecified chronic kidney disease: Secondary | ICD-10-CM | POA: Diagnosis present

## 2012-06-30 HISTORY — DX: Occlusion and stenosis of unspecified cerebral artery: I66.9

## 2012-06-30 HISTORY — DX: Paroxysmal atrial fibrillation: I48.0

## 2012-06-30 HISTORY — DX: Chronic systolic (congestive) heart failure: I50.22

## 2012-06-30 HISTORY — DX: Cerebral infarction, unspecified: I63.9

## 2012-06-30 LAB — CBC WITH DIFFERENTIAL/PLATELET
Basophils Absolute: 0 10*3/uL (ref 0.0–0.1)
Basophils Relative: 0 % (ref 0–1)
Lymphocytes Relative: 8 % — ABNORMAL LOW (ref 12–46)
MCHC: 33.3 g/dL (ref 30.0–36.0)
Neutro Abs: 7 10*3/uL (ref 1.7–7.7)
Neutrophils Relative %: 83 % — ABNORMAL HIGH (ref 43–77)
Platelets: 163 10*3/uL (ref 150–400)
RDW: 18.2 % — ABNORMAL HIGH (ref 11.5–15.5)
WBC: 8.3 10*3/uL (ref 4.0–10.5)

## 2012-06-30 LAB — MAGNESIUM: Magnesium: 2 mg/dL (ref 1.5–2.5)

## 2012-06-30 LAB — COMPREHENSIVE METABOLIC PANEL
ALT: 18 U/L (ref 0–53)
AST: 25 U/L (ref 0–37)
Albumin: 3 g/dL — ABNORMAL LOW (ref 3.5–5.2)
CO2: 25 mEq/L (ref 19–32)
Chloride: 102 mEq/L (ref 96–112)
GFR calc non Af Amer: 44 mL/min — ABNORMAL LOW (ref >90)
Sodium: 139 mEq/L (ref 135–145)
Total Bilirubin: 1.3 mg/dL — ABNORMAL HIGH (ref 0.3–1.2)

## 2012-06-30 LAB — PROTIME-INR
INR: 2.71 — ABNORMAL HIGH (ref 0.00–1.49)
Prothrombin Time: 27.4 seconds — ABNORMAL HIGH (ref 11.6–15.2)

## 2012-06-30 LAB — POCT I-STAT TROPONIN I

## 2012-06-30 MED ORDER — POTASSIUM CHLORIDE CRYS ER 20 MEQ PO TBCR
20.0000 meq | EXTENDED_RELEASE_TABLET | Freq: Every day | ORAL | Status: DC
  Administered 2012-06-30 – 2012-07-03 (×4): 20 meq via ORAL
  Filled 2012-06-30 (×5): qty 1

## 2012-06-30 MED ORDER — FUROSEMIDE 10 MG/ML IJ SOLN
100.0000 mg | Freq: Three times a day (TID) | INTRAVENOUS | Status: DC
  Administered 2012-06-30 – 2012-07-03 (×7): 100 mg via INTRAVENOUS
  Filled 2012-06-30 (×9): qty 10

## 2012-06-30 MED ORDER — SODIUM CHLORIDE 0.9 % IJ SOLN
3.0000 mL | Freq: Two times a day (BID) | INTRAMUSCULAR | Status: DC
Start: 2012-06-30 — End: 2012-07-07
  Administered 2012-06-30 – 2012-07-06 (×11): 3 mL via INTRAVENOUS

## 2012-06-30 MED ORDER — METOPROLOL TARTRATE 25 MG PO TABS
25.0000 mg | ORAL_TABLET | Freq: Two times a day (BID) | ORAL | Status: DC
  Administered 2012-06-30 – 2012-07-07 (×14): 25 mg via ORAL
  Filled 2012-06-30 (×14): qty 1

## 2012-06-30 MED ORDER — ATORVASTATIN CALCIUM 40 MG PO TABS
40.0000 mg | ORAL_TABLET | Freq: Every day | ORAL | Status: DC
  Administered 2012-06-30 – 2012-07-06 (×7): 40 mg via ORAL
  Filled 2012-06-30 (×8): qty 1

## 2012-06-30 MED ORDER — COLCHICINE 0.6 MG PO TABS
0.3000 mg | ORAL_TABLET | Freq: Every day | ORAL | Status: DC
  Administered 2012-07-01: 0.3 mg via ORAL
  Administered 2012-07-02: 11:00:00 via ORAL
  Administered 2012-07-03 – 2012-07-07 (×5): 0.3 mg via ORAL
  Filled 2012-06-30 (×7): qty 0.5

## 2012-06-30 MED ORDER — FEBUXOSTAT 40 MG PO TABS
80.0000 mg | ORAL_TABLET | Freq: Every day | ORAL | Status: DC
  Administered 2012-07-01 – 2012-07-07 (×7): 80 mg via ORAL
  Filled 2012-06-30 (×7): qty 2

## 2012-06-30 MED ORDER — WARFARIN SODIUM 2.5 MG PO TABS
2.5000 mg | ORAL_TABLET | Freq: Once | ORAL | Status: DC
  Filled 2012-06-30: qty 1

## 2012-06-30 MED ORDER — ACETAMINOPHEN 325 MG PO TABS: 650.0000 mg | ORAL_TABLET | ORAL | Status: DC | PRN

## 2012-06-30 MED ORDER — BENZONATATE 100 MG PO CAPS
100.0000 mg | ORAL_CAPSULE | Freq: Three times a day (TID) | ORAL | Status: DC | PRN
  Administered 2012-07-04 – 2012-07-06 (×2): 100 mg via ORAL
  Filled 2012-06-30 (×2): qty 1

## 2012-06-30 MED ORDER — CLONAZEPAM 0.5 MG PO TABS
0.5000 mg | ORAL_TABLET | Freq: Every day | ORAL | Status: DC
  Administered 2012-06-30 – 2012-07-06 (×7): 0.5 mg via ORAL
  Filled 2012-06-30 (×7): qty 1

## 2012-06-30 MED ORDER — LATANOPROST 0.005 % OP SOLN
1.0000 [drp] | Freq: Every day | OPHTHALMIC | Status: DC
  Administered 2012-06-30 – 2012-07-04 (×5): 1 [drp] via OPHTHALMIC
  Filled 2012-06-30: qty 2.5

## 2012-06-30 MED ORDER — ASPIRIN EC 81 MG PO TBEC
81.0000 mg | DELAYED_RELEASE_TABLET | Freq: Every day | ORAL | Status: DC
  Administered 2012-07-01 – 2012-07-07 (×7): 81 mg via ORAL
  Filled 2012-06-30 (×7): qty 1

## 2012-06-30 MED ORDER — ISOSORBIDE MONONITRATE ER 60 MG PO TB24
60.0000 mg | ORAL_TABLET | Freq: Every day | ORAL | Status: DC
  Administered 2012-07-01 – 2012-07-07 (×7): 60 mg via ORAL
  Filled 2012-06-30 (×7): qty 1

## 2012-06-30 MED ORDER — FUROSEMIDE 10 MG/ML IJ SOLN
80.0000 mg | Freq: Once | INTRAMUSCULAR | Status: AC
  Administered 2012-06-30: 80 mg via INTRAVENOUS
  Filled 2012-06-30: qty 8

## 2012-06-30 MED ORDER — WARFARIN - PHARMACIST DOSING INPATIENT
Freq: Every day | Status: DC
  Administered 2012-07-04 – 2012-07-06 (×2)

## 2012-06-30 MED ORDER — FEBUXOSTAT 80 MG PO TABS: 1.0000 | ORAL_TABLET | Freq: Every day | ORAL | Status: DC

## 2012-06-30 MED ORDER — ONDANSETRON HCL 4 MG/2ML IJ SOLN: 4.0000 mg | Freq: Four times a day (QID) | INTRAMUSCULAR | Status: DC | PRN

## 2012-06-30 MED ORDER — INSULIN ASPART 100 UNIT/ML ~~LOC~~ SOLN
0.0000 [IU] | Freq: Three times a day (TID) | SUBCUTANEOUS | Status: DC
  Administered 2012-07-01: 3 [IU] via SUBCUTANEOUS
  Administered 2012-07-02: 12:00:00 via SUBCUTANEOUS
  Administered 2012-07-02 – 2012-07-03 (×3): 2 [IU] via SUBCUTANEOUS
  Administered 2012-07-03: 1 [IU] via SUBCUTANEOUS
  Administered 2012-07-04 (×2): 2 [IU] via SUBCUTANEOUS
  Administered 2012-07-04: 3 [IU] via SUBCUTANEOUS
  Administered 2012-07-05 (×2): 2 [IU] via SUBCUTANEOUS
  Administered 2012-07-05 – 2012-07-06 (×2): 3 [IU] via SUBCUTANEOUS
  Administered 2012-07-06 – 2012-07-07 (×3): 2 [IU] via SUBCUTANEOUS

## 2012-06-30 MED ORDER — SODIUM CHLORIDE 0.9 % IV SOLN: 250.0000 mL | INTRAVENOUS | Status: DC | PRN

## 2012-06-30 MED ORDER — AMIODARONE HCL 200 MG PO TABS
200.0000 mg | ORAL_TABLET | Freq: Every day | ORAL | Status: DC
  Administered 2012-07-01 – 2012-07-07 (×7): 200 mg via ORAL
  Filled 2012-06-30 (×7): qty 1

## 2012-06-30 MED ORDER — SODIUM CHLORIDE 0.9 % IJ SOLN: 3.0000 mL | INTRAMUSCULAR | Status: DC | PRN

## 2012-06-30 NOTE — ED Notes (Signed)
Pt presents with continued cough.  Pt was seen here a few days ago for same, reports productive cough with yellow phlegm and shortness of breath.

## 2012-06-30 NOTE — ED Notes (Signed)
Condom cath placed, pt tolerated well

## 2012-06-30 NOTE — Progress Notes (Signed)
ANTICOAGULATION CONSULT NOTE - Initial Consult  Pharmacy Consult for Warfarin Indication: Hx Afib  No Known Allergies  Patient Measurements:    Vital Signs: Temp: 98.4 F (36.9 C) (05/02 1219) Temp src: Oral (05/02 1219) BP: 116/56 mmHg (05/02 1630) Pulse Rate: 65 (05/02 1630)  Labs:  Recent Labs  06/30/12 1250  HGB 13.6  HCT 40.8  PLT 163  LABPROT 27.4*  INR 2.71*  CREATININE 1.50*    The CrCl is unknown because both a height and weight (above a minimum accepted value) are required for this calculation.   Medical History: Past Medical History  Diagnosis Date  . Gout   . Diabetes mellitus   . Hypertension   . Hyperlipidemia   . Coronary artery disease   . Cerebral embolism   . Paroxysmal atrial fibrillation   . Chronic systolic heart failure    Assessment: 78 y.o. M who presented to the [facility] on 06/30/12 with CHF exacerbation. Pharmacy was consulted to resume the patient's home warfarin for hx Afib.  PTA the patient was taking 2.5 mg daily. INR on admission was therapeutic (INR 2.71, goal of 2-3). Hgb/Hct/Plt wnl. No s/sx of bleeding noted.   Goal of Therapy:  INR 2-3   Plan:  1. Warfarin 2.5 mg x 1 dose 2. Daily PT/INR 3. Will continue to monitor for any signs/symptoms of bleeding and will follow up with PT/INR in the a.m.   Georgina Pillion, PharmD, BCPS Clinical Pharmacist Pager: [phone] 06/30/2012 5:51 PM

## 2012-06-30 NOTE — H&P (Signed)
[name] is a 78 y.o. male  Admit date: 06/30/2012 Referring Physician: Cam Hill, M.D. Primary Cardiologist:: [name], M.D. Chief complaint / reason for admission: CHF  HPI: [name] is 56 and has a long-standing history of ischemic heart disease and systolic heart failure. Last evaluation was with a nuclear perfusion study where his ejection fraction was 15% in June 2013. Over the past 7-10 days he has had upper respiratory congestion, nasal stuffiness, cough, and dyspnea progressive over the past 5 days. On 06/26/2012, he came to the emergency roomwhere admission was recommended but he refused. At that time the BNP was 3500 and is evidence of pulmonary congestion on the chest x-ray. During this time he denies chest discomfort. He has not noted lower extremity swelling. He lays in the bed all day. He very rarely gets out of bed. He is accompanied this evening by his son and daughter.    PMH:    Past Medical History  Diagnosis Date  . Gout   . Diabetes mellitus   . Hypertension   . Hyperlipidemia   . Coronary artery disease   . Cerebral embolism   . Paroxysmal atrial fibrillation   . Chronic systolic heart failure     PSH:    Past Surgical History  Procedure Laterality Date  . Coronary artery bypass graft      ALLERGIES:   Review of patient's allergies indicates no known allergies.  Prior to Admit Meds:   (Not in a hospital admission) Family HX:    Family History  Problem Relation Age of Onset  . Coronary artery disease Maternal Aunt    Social HX:    History   Social History  . Marital Status: Married    Spouse Name: N/A    Number of Children: N/A  . Years of Education: N/A   Occupational History  . custodial     retired   Social History Main Topics  . Smoking status: Former Smoker -- 1.00 packs/day for 25 years    Types: Cigarettes    Quit date: 03/01/1968  . Smokeless tobacco: Not on file  . Alcohol Use: Not on file  . Drug Use: Not  on file  . Sexually Active: Not on file   Other Topics Concern  . Not on file   Social History Narrative  . No narrative on file     ROS: sore buttocks area and in the past has had skin breakdown. He denies chills, fever, dysuria, no hemoptysis, palpitations, syncope, transient neurological symptoms, and bright red blood per rectum.  Physical Exam: Blood pressure 116/56, pulse 65, temperature 98.4 F (36.9 C), temperature source Oral, resp. rate 20, SpO2 95.00%.    Morbidly obese lying comfortably in bed at 30. Skin is clear.  HEENT exam reveals no jaundice. Extraocular movements are full.  Neck exam reveals no significant JVD. No carotid bruits are heard.  Cardiac exam reveals a 2 of 6 systolic murmur along the left lower sternal border. A soft S3 gallop is audible. No diastolic murmurs heard.  Abdomen is markedly obese. No tenderness is noted. Bowel sounds are normal.  There is presacral edema. There is no peripheral edema.  Neurological exam is grossly intact without motor obvious sensory deficit. Labs:   Lab Results  Component Value Date   WBC 8.3 06/30/2012   HGB 13.6 06/30/2012   HCT 40.8 06/30/2012   MCV 86.1 06/30/2012   PLT 163 06/30/2012  Recent Labs Lab 06/30/12 1250  NA 139  K 3.7  CL 102  CO2 25  BUN 28*  CREATININE 1.50*  CALCIUM 9.3  PROT 8.1  BILITOT 1.3*  ALKPHOS 104  ALT 18  AST 25  GLUCOSE 143*   Lab Results  Component Value Date   CKTOTAL 42 08/23/2011   CKMB 1.7 08/23/2011   TROPONINI <0.30 06/27/2012     Radiology:   CHEST - 2 VIEW  Comparison: 06/27/2012  Findings: Cardiomegaly again noted. Status post CABG. There is  mild interstitial prominence right lung. Findings suspicious for  asymmetric edema or asymmetric airspace disease. Clinical  correlation is necessary. No segmental infiltrate.  IMPRESSION:  Status post CABG. There is mild interstitial prominence right  lung. Findings suspicious for asymmetric edema or asymmetric    airspace disease. Clinical correlation is necessary. No segmental  infiltrate.  Original Report Authenticated By: [name], M.D.   EKG:   Sinus rhythm, first-degree AV block, intraventricular conduction delay/and incomplete left bundle  ASSESSMENT:   1. Chronic systolic heart failure with acute decompensation. Baseline LVEF in 2013 was 10-15%  2. Diabetes mellitus  3. Stage III chronic kidney disease  4. History of paroxysmal atrial fibrillation on chronic amiodarone suppression  5. Chronic Coumadin therapy  Plan:  1. IV Lasix  2. Continue Coumadin  3. Check ESR to rule out any component of amio lung toxicity  4. Watch renal function closely  5. Sliding scale insulin as needed  [name] 06/30/2012 5:14 PM

## 2012-06-30 NOTE — ED Notes (Signed)
Admitting MD at bedside.

## 2012-06-30 NOTE — ED Provider Notes (Addendum)
History     CSN: [id number]  Arrival date & time 06/30/12  1211   First MD Initiated Contact with Patient 06/30/12 1232      Chief Complaint  Patient presents with  . Cough    (Consider location/radiation/quality/duration/timing/severity/associated sxs/prior treatment) Patient is a 78 y.o. male presenting with cough. The history is provided by the patient.  Cough Cough characteristics:  Productive (wet cough) Sputum characteristics:  Yellow Severity:  Mild Onset quality:  Gradual Duration: cough and SOB have been worse over the last 1 week. Timing:  Constant Progression:  Worsening Chronicity:  Recurrent Smoker: no   Context: not sick contacts and not upper respiratory infection   Relieved by:  Nothing Worsened by:  Nothing tried Ineffective treatments:  None tried Associated symptoms: chest pain and shortness of breath   Associated symptoms: no chills, no diaphoresis, no fever, no headaches, no rash, no rhinorrhea, no sinus congestion and no wheezing   Chest pain:    Quality:  Aching   Severity:  Mild   Onset quality:  Gradual   Duration:  1 week   Timing:  Intermittent   Progression:  Unchanged   Chronicity:  Recurrent Risk factors: no recent infection and no recent travel     Past Medical History  Diagnosis Date  . Gout   . Diabetes mellitus   . Hypertension   . Hyperlipidemia   . Coronary artery disease     Past Surgical History  Procedure Laterality Date  . Coronary artery bypass graft      Family History  Problem Relation Age of Onset  . Coronary artery disease Maternal Aunt     History  Substance Use Topics  . Smoking status: Former Smoker -- 1.00 packs/day for 25 years    Types: Cigarettes    Quit date: 03/01/1968  . Smokeless tobacco: Not on file  . Alcohol Use: Not on file      Review of Systems  Constitutional: Negative for fever, chills and diaphoresis.  HENT: Negative for rhinorrhea.   Respiratory: Positive for cough and  shortness of breath. Negative for wheezing.   Cardiovascular: Positive for chest pain.  Skin: Negative for rash.  Neurological: Negative for weakness and headaches.  All other systems reviewed and are negative.    Allergies  Review of patient's allergies indicates no known allergies.  Home Medications   Current Outpatient Rx  Name  Route  Sig  Dispense  Refill  . amiodarone (PACERONE) 200 MG tablet   Oral   Take 200 mg by mouth daily.         Marland Kitchen aspirin EC 81 MG tablet   Oral   Take 81 mg by mouth daily.         Marland Kitchen atorvastatin (LIPITOR) 40 MG tablet   Oral   Take 40 mg by mouth at bedtime.          . benzonatate (TESSALON) 100 MG capsule   Oral   Take 1 capsule (100 mg total) by mouth 3 (three) times daily as needed for cough.   20 capsule   0   . clonazePAM (KLONOPIN) 0.5 MG tablet   Oral   Take 0.5 mg by mouth at bedtime as needed. For anxiety         . colchicine 0.6 MG tablet   Oral   Take 0.5 tablets (0.3 mg total) by mouth daily.         . Febuxostat (ULORIC) 80 MG TABS   Oral  Take 1 tablet by mouth daily.         . furosemide (LASIX) 80 MG tablet   Oral   Take 120 mg by mouth daily.          . hydrALAZINE (APRESOLINE) 10 MG tablet   Oral   Take 1 tablet (10 mg total) by mouth every 8 (eight) hours.   90 tablet   0   . isosorbide mononitrate (IMDUR) 60 MG 24 hr tablet   Oral   Take 60 mg by mouth daily.         Marland Kitchen latanoprost (XALATAN) 0.005 % ophthalmic solution   Both Eyes   Place 1 drop into both eyes at bedtime.         . metoprolol tartrate (LOPRESSOR) 25 MG tablet   Oral   Take 25 mg by mouth 2 (two) times daily.         . potassium chloride SA (K-DUR,KLOR-CON) 20 MEQ tablet   Oral   Take 20 mEq by mouth daily.         Marland Kitchen warfarin (COUMADIN) 5 MG tablet   Oral   Take 0.5 tablets (2.5 mg total) by mouth daily. DO NOT TAKE COUMADIN UNTIL ADVISED BY DR. [facility].           BP 111/65  Pulse 67  Temp(Src)  98.4 F (36.9 C) (Oral)  Resp 19  SpO2 94%  Physical Exam  Nursing note and vitals reviewed. Constitutional: He is oriented to person, place, and time. He appears well-developed and well-nourished. No distress.  HENT:  Head: Normocephalic and atraumatic.  Mouth/Throat: Oropharynx is clear and moist.  Eyes: Conjunctivae and EOM are normal. Pupils are equal, round, and reactive to light.  Neck: Normal range of motion. Neck supple.  Cardiovascular: Normal rate, regular rhythm and intact distal pulses.   No murmur heard. Pulmonary/Chest: Effort normal. Not tachypneic. No respiratory distress. He has wheezes. He has rhonchi. He has rales.  Well healed midline sternotomy scar  Abdominal: Soft. He exhibits no distension. There is no tenderness. There is no rebound and no guarding.  Musculoskeletal: Normal range of motion. He exhibits no edema and no tenderness.  Contractures in the lower ext  Neurological: He is alert and oriented to person, place, and time.  Skin: Skin is warm and dry. No rash noted. No erythema.  Psychiatric: He has a normal mood and affect. His behavior is normal.    ED Course  Procedures (including critical care time)  Labs Reviewed  CBC WITH DIFFERENTIAL - Abnormal; Notable for the following:    RDW 18.2 (*)    Neutrophils Relative 83 (*)    Lymphocytes Relative 8 (*)    All other components within normal limits  COMPREHENSIVE METABOLIC PANEL - Abnormal; Notable for the following:    Glucose, Bld 143 (*)    BUN 28 (*)    Creatinine, Ser 1.50 (*)    Albumin 3.0 (*)    Total Bilirubin 1.3 (*)    GFR calc non Af Amer 44 (*)    GFR calc Af Amer 51 (*)    All other components within normal limits  PRO B NATRIURETIC PEPTIDE - Abnormal; Notable for the following:    Pro B Natriuretic peptide (BNP) 4037.0 (*)    All other components within normal limits  PROTIME-INR - Abnormal; Notable for the following:    Prothrombin Time 27.4 (*)    INR 2.71 (*)    All other  components  within normal limits  POCT I-STAT TROPONIN I   Dg Chest 2 View  06/30/2012  *RADIOLOGY REPORT*  Clinical Data: Shortness of breath, cough  CHEST - 2 VIEW  Comparison: 06/27/2012  Findings: Cardiomegaly again noted.  Status post CABG.  There is mild interstitial prominence right lung.  Findings suspicious for asymmetric edema or asymmetric airspace disease.  Clinical correlation is necessary.  No segmental infiltrate.  IMPRESSION: Status post CABG.  There is mild interstitial prominence right lung.  Findings suspicious for asymmetric edema or asymmetric airspace disease.  Clinical correlation is necessary.  No segmental infiltrate.   Original Report Authenticated By: Natasha Mead, M.D.      Date: 06/30/2012  Rate: 61  Rhythm: normal sinus rhythm  QRS Axis: normal  Intervals: normal  ST/T Wave abnormalities: nonspecific ST/T changes  Conduction Disutrbances:nonspecific intraventricular conduction delay and Incomplete left bundle branch block  Narrative Interpretation:   Old EKG Reviewed: unchanged    1. CHF exacerbation       MDM    The patient's with a history of diabetes, hypertension, hyperlipidemia, coronary bypass who presents with worsening congestion and cough. He states the cough may be productive intermittently but it got worse along with the shortness of breath over the last week. Patient was seen several days ago in the emergency room and discharged home however at that time he did showed he had pulmonary edema and a BNP of 3500. He also states that he gets intermittent chest tightness. He denies any fever or infectious symptoms. On exam he has bilateral rhonchi and rales. Possible scant wheezing. No lower extremity edema.   Concern for possible fluid overload as this could be a CHF exacerbation versus infectious etiology. Dr. Verdis Prime patient's cardiologist is present to evaluate the patient.  CBC, CMP, BNP, INR, chest x-ray, EKG, troponin pending.   2:56 PM The  patient has a labs and chest x-ray findings consistent with CHF. He was given IV Lasix and will be admitted for further care.    Gwyneth Sprout, MD 06/30/12 1457  Gwyneth Sprout, MD 06/30/12 1457

## 2012-07-01 LAB — PROTIME-INR: INR: 2.43 — ABNORMAL HIGH (ref 0.00–1.49)

## 2012-07-01 LAB — GLUCOSE, CAPILLARY
Glucose-Capillary: 121 mg/dL — ABNORMAL HIGH (ref 70–99)
Glucose-Capillary: 107 mg/dL — ABNORMAL HIGH (ref 70–99)
Glucose-Capillary: 116 mg/dL — ABNORMAL HIGH (ref 70–99)

## 2012-07-01 LAB — TROPONIN I: Troponin I: <0.3 ng/mL (ref <0.30)

## 2012-07-01 LAB — BASIC METABOLIC PANEL
Calcium: 9.4 mg/dL (ref 8.4–10.5)
Creatinine, Ser: 1.45 mg/dL — ABNORMAL HIGH (ref 0.50–1.35)
GFR calc non Af Amer: 46 mL/min — ABNORMAL LOW (ref >90)
Glucose, Bld: 115 mg/dL — ABNORMAL HIGH (ref 70–99)
Sodium: 137 mEq/L (ref 135–145)

## 2012-07-01 LAB — TSH: TSH: 2.236 u[IU]/mL (ref 0.350–4.500)

## 2012-07-01 MED ORDER — WARFARIN SODIUM 2.5 MG PO TABS
2.5000 mg | ORAL_TABLET | Freq: Once | ORAL | Status: AC
  Administered 2012-07-01: 2.5 mg via ORAL
  Filled 2012-07-01: qty 1

## 2012-07-01 NOTE — Progress Notes (Addendum)
ANTICOAGULATION CONSULT NOTE   Pharmacy Consult for Warfarin Indication: Hx Afib  Patient Measurements: Height: 5\' 11"  (180.3 cm) Weight: 262 lb 2 oz (118.9 kg) IBW/kg (Calculated) : 75.3  Vital Signs: Temp: 97.8 F (36.6 C) (05/03 0736) Temp src: Oral (05/03 0736) BP: 117/62 mmHg (05/03 0500) Pulse Rate: 53 (05/03 0500)  Labs:  Recent Labs  06/30/12 1250 06/30/12 1733 07/01/12 07/01/12 0535  HGB 13.6  --   --   --   HCT 40.8  --   --   --   PLT 163  --   --   --   LABPROT 27.4*  --   --  25.3*  INR 2.71*  --   --  2.43*  CREATININE 1.50*  --   --  1.45*  TROPONINI  --  <0.30 <0.30 <0.30    Estimated Creatinine Clearance: 57.7 ml/min (by C-G formula based on Cr of 1.45).   Medical History: Past Medical History  Diagnosis Date  . Gout   . Diabetes mellitus   . Hypertension   . Hyperlipidemia   . Coronary artery disease   . Cerebral embolism   . Paroxysmal atrial fibrillation   . Chronic systolic heart failure   . Stroke    Assessment: 78 y.o. M who presented to the [facility] on 06/30/12 with CHF exacerbation. Pharmacy was consulted to resume the patient's home warfarin for hx Afib.  PTA the patient was taking 2.5 mg daily. INR on admission was therapeutic (INR 2.71, goal of 2-3). Hgb/Hct/Plt wnl. No s/sx of bleeding noted.   INR has dropped to 2.3 this morning. Warfarin dose entered but not charted as given last night as patient stated they have already taken their dose for the day.  Goal of Therapy:  INR 2-3   Plan:  1. Warfarin 2.5 mg x 1 dose 2. Daily PT/INR 3. Will continue to monitor for any signs/symptoms of bleeding and will follow up with PT/INR in the a.m.   Sheppard Coil PharmD., BCPS Clinical Pharmacist Pager [phone] 07/01/2012 8:48 AM

## 2012-07-01 NOTE — Progress Notes (Addendum)
SUBJECTIVE:  Feeling better - thinks his breathing is better  OBJECTIVE:   Vitals:   Filed Vitals:   07/01/12 0400 07/01/12 0500 07/01/12 0736 07/01/12 0737  BP: 112/60 117/62  128/62  Pulse: 62 53  55  Temp: 97.8 F (36.6 C)  97.8 F (36.6 C)   TempSrc:   Oral   Resp:    18  Height:      Weight:  118.9 kg (262 lb 2 oz)    SpO2: 97% 98%  99%   I&O's:   Intake/Output Summary (Last 24 hours) at 07/01/12 1002 Last data filed at 07/01/12 0500  Gross per 24 hour  Intake    110 ml  Output   1600 ml  Net  -1490 ml   TELEMETRY: Reviewed telemetry pt in NSR:     PHYSICAL EXAM General: Well developed, well nourished, in no acute distress Head: Eyes PERRLA, No xanthomas.   Normal cephalic and atramatic  Lungs:   Crackles at bases. Heart:   HRRR S1 S2 Pulses are 2+ & equal. Abdomen: Bowel sounds are positive, abdomen soft and non-tender without masses Extremities:   No clubbing, cyanosis or edema.  DP +1 Neuro: Alert and oriented X 3. Psych:  Good affect, responds appropriately   LABS: Basic Metabolic Panel:  Recent Labs  13/24/40 1250 06/30/12 1732 07/01/12 0535  NA 139  --  137  K 3.7  --  3.6  CL 102  --  101  CO2 25  --  25  GLUCOSE 143*  --  115*  BUN 28*  --  30*  CREATININE 1.50*  --  1.45*  CALCIUM 9.3  --  9.4  MG  --  2.0  --    Liver Function Tests:  Recent Labs  06/30/12 1250  AST 25  ALT 18  ALKPHOS 104  BILITOT 1.3*  PROT 8.1  ALBUMIN 3.0*   No results found for this basename: LIPASE, AMYLASE,  in the last 72 hours CBC:  Recent Labs  06/30/12 1250  WBC 8.3  NEUTROABS 7.0  HGB 13.6  HCT 40.8  MCV 86.1  PLT 163   Cardiac Enzymes:  Recent Labs  06/30/12 1733 07/01/12 07/01/12 0535  TROPONINI <0.30 <0.30 <0.30   BNP: No components found with this basename: POCBNP,  D-Dimer: No results found for this basename: DDIMER,  in the last 72 hours Hemoglobin A1C:  Recent Labs  06/30/12 1732  HGBA1C 6.5*   Fasting Lipid  Panel: No results found for this basename: CHOL, HDL, LDLCALC, TRIG, CHOLHDL, LDLDIRECT,  in the last 72 hours Thyroid Function Tests:  Recent Labs  06/30/12 1732  TSH 2.236   Anemia Panel: No results found for this basename: VITAMINB12, FOLATE, FERRITIN, TIBC, IRON, RETICCTPCT,  in the last 72 hours Coag Panel:   Lab Results  Component Value Date   INR 2.43* 07/01/2012   INR 2.71* 06/30/2012   INR 2.50* 06/27/2012    RADIOLOGY: Dg Chest 2 View  06/30/2012  *RADIOLOGY REPORT*  Clinical Data: Shortness of breath, cough  CHEST - 2 VIEW  Comparison: 06/27/2012  Findings: Cardiomegaly again noted.  Status post CABG.  There is mild interstitial prominence right lung.  Findings suspicious for asymmetric edema or asymmetric airspace disease.  Clinical correlation is necessary.  No segmental infiltrate.  IMPRESSION: Status post CABG.  There is mild interstitial prominence right lung.  Findings suspicious for asymmetric edema or asymmetric airspace disease.  Clinical correlation is necessary.  No segmental infiltrate.  Original Report Authenticated By: Natasha Mead, M.D.    Dg Chest 2 View  06/27/2012  *RADIOLOGY REPORT*  Clinical Data: Cough, shortness of breath.  CHEST - 2 VIEW  Comparison: 08/23/2011  Findings: Prior CABG.  Cardiomegaly with vascular congestion. Interstitial prominence may reflect mild interstitial edema.  No confluent opacities or effusions.  IMPRESSION: Cardiomegaly vascular congestion and possible mild interstitial edema.   Original Report Authenticated By: Charlett Nose, M.D.    ASSESSMENT:  1. Chronic systolic heart failure with acute decompensation. Baseline LVEF in 2013 was 10-15% - thus far he has put out 1.5L  2. Diabetes mellitus  3. Stage III chronic kidney disease - stable with diuresis 4. History of paroxysmal atrial fibrillation on chronic amiodarone suppression maintaining NSR 5. Chronic Coumadin therapy  Plan:  1. IV Lasix  2. Continue Coumadin  3. Check ESR to rule  out any component of amio lung toxicity  4. Watch renal function closely  5.  Transfer to tele bed      Quintella Reichert, MD  07/01/2012  10:02 AM

## 2012-07-01 NOTE — Care Management Note (Signed)
Cm spoke with patient with multiple family members present at bedside concerning dc planning. Pt states previously active with [facility] for [facility] services. Pt states request HHRN upon discharge but no HHPT. Pt lives home with spouse. Awaiting further assessment of needs upon discharge.   Leonie Green [phone]

## 2012-07-01 NOTE — Progress Notes (Signed)
Pharmacist Heart Failure Core Measure Documentation  Assessment: [name] has an EF documented as 10% on 6/13 by echo.  Rationale: Heart failure patients with left ventricular systolic dysfunction (LVSD) and an EF < 40% should be prescribed an angiotensin converting enzyme inhibitor (ACEI) or angiotensin receptor blocker (ARB) at discharge unless a contraindication is documented in the medical record.  This patient is not currently on an ACEI or ARB for HF.  This note is being placed in the record in order to provide documentation that a contraindication to the use of these agents is present for this encounter.  ACE Inhibitor or Angiotensin Receptor Blocker is contraindicated (specify all that apply)  []   ACEI allergy AND ARB allergy []   Angioedema []   Moderate or severe aortic stenosis []   Hyperkalemia []   Hypotension []   Renal artery stenosis [x]   Worsening renal function, preexisting renal disease or dysfunction   [name] 07/01/2012 1:55 PM

## 2012-07-01 NOTE — Progress Notes (Signed)
Pt. Transferred from 2900 received pt placed on tele, given call bell instructed to call for assistance. No needs at this time.

## 2012-07-01 NOTE — Progress Notes (Signed)
Nutrition Brief Note  Patient identified on the Malnutrition Screening Tool (MST) Report  Body mass index is 36.58 kg/(m^2). Patient meets criteria for obesity class 1 based on current BMI.   Current diet order is Heart Healthy, patient is consuming approximately 100% of meals at this time per pt report. Labs and medications reviewed.   RD met with pt who denies wt loss or poor intake PTA.  Denies additional nutrition-related concerns.  RD stated availability to discuss nutrition therapy for chronic disease- pt with CKD, DM, and CHF.  Pt again denies need stating "my wife handles that."  RD offered to meet with wife if needed.  Pt provided RD with menu with several days worth of meals circled.  RD made a copy and faxed to [facility].  Returned menu to patient.  No nutrition interventions warranted at this time. If nutrition issues arise, please consult RD.   Loyce Dys, MS RD LDN Clinical Inpatient Dietitian Pager: [phone] Weekend/After hours pager: [phone]

## 2012-07-02 ENCOUNTER — Inpatient Hospital Stay (HOSPITAL_COMMUNITY): Payer: Medicare Other

## 2012-07-02 LAB — BASIC METABOLIC PANEL
CO2: 25 mEq/L (ref 19–32)
Calcium: 9.3 mg/dL (ref 8.4–10.5)
Chloride: 102 mEq/L (ref 96–112)
Glucose, Bld: 137 mg/dL — ABNORMAL HIGH (ref 70–99)
Sodium: 137 mEq/L (ref 135–145)

## 2012-07-02 LAB — GLUCOSE, CAPILLARY
Glucose-Capillary: 134 mg/dL — ABNORMAL HIGH (ref 70–99)
Glucose-Capillary: 149 mg/dL — ABNORMAL HIGH (ref 70–99)

## 2012-07-02 LAB — PROTIME-INR
INR: 2.41 — ABNORMAL HIGH (ref 0.00–1.49)
Prothrombin Time: 25.1 seconds — ABNORMAL HIGH (ref 11.6–15.2)

## 2012-07-02 LAB — SEDIMENTATION RATE: Sed Rate: 75 mm/hr — ABNORMAL HIGH (ref 0–16)

## 2012-07-02 LAB — PRO B NATRIURETIC PEPTIDE: Pro B Natriuretic peptide (BNP): 2736 pg/mL — ABNORMAL HIGH (ref 0–450)

## 2012-07-02 MED ORDER — WARFARIN SODIUM 2.5 MG PO TABS
2.5000 mg | ORAL_TABLET | Freq: Every day | ORAL | Status: DC
  Administered 2012-07-02 – 2012-07-06 (×5): 2.5 mg via ORAL
  Filled 2012-07-02 (×6): qty 1

## 2012-07-02 MED ORDER — BISACODYL 10 MG RE SUPP
10.0000 mg | Freq: Every day | RECTAL | Status: DC | PRN
  Administered 2012-07-06 – 2012-07-07 (×2): 10 mg via RECTAL
  Filled 2012-07-02 (×2): qty 1

## 2012-07-02 NOTE — Progress Notes (Signed)
ANTICOAGULATION CONSULT NOTE - Follow Up Consult  Pharmacy Consult for Coumadin Indication: atrial fibrillation  Allergies  Allergen Reactions  . Other     Ear drops  I used made my throat feel funny     Patient Measurements: Height: 5\' 11"  (180.3 cm) Weight: 258 lb 13.1 oz (117.4 kg) IBW/kg (Calculated) : 75.3  Vital Signs: Temp: 97.9 F (36.6 C) (05/04 0429) Temp src: Oral (05/04 0429) BP: 115/68 mmHg (05/04 0429) Pulse Rate: 73 (05/04 0429)  Labs:  Recent Labs  06/30/12 1250 06/30/12 1733 07/01/12 07/01/12 0535 07/02/12 0545  HGB 13.6  --   --   --   --   HCT 40.8  --   --   --   --   PLT 163  --   --   --   --   LABPROT 27.4*  --   --  25.3* 25.1*  INR 2.71*  --   --  2.43* 2.41*  CREATININE 1.50*  --   --  1.45* 1.57*  TROPONINI  --  <0.30 <0.30 <0.30  --     Estimated Creatinine Clearance: 53 ml/min (by C-G formula based on Cr of 1.57).  Assessment:   INR remains therapeutic.  No Coumadin given here on 5/2, and INR dropped some but still within goal range. On home Amiodarone, ruling out component of pulmonary toxicity. ESR 75.  Noted productive cough, CXR pending. Afebrile, WBC 8.3 on 5/2, no antibiotics yet.  Goal of Therapy:  INR 2-3 Monitor platelets by anticoagulation protocol: Yes   Plan:   Continue Coumadin 2.5 mg daily.  Daily PT/INR for now.  Will follow up amiodarone plans.   Dennie Fetters, [place] Pager: [phone] 07/02/2012,1:12 PM

## 2012-07-02 NOTE — Progress Notes (Addendum)
SUBJECTIVE:  Coughing some with productive cough, no chest pain  OBJECTIVE:   Vitals:   Filed Vitals:   07/01/12 1200 07/01/12 1300 07/01/12 2022 07/02/12 0429  BP: 108/66 124/67 106/74 115/68  Pulse: 58 57 58 73  Temp:  97.8 F (36.6 C) 98.2 F (36.8 C) 97.9 F (36.6 C)  TempSrc:  Oral Oral Oral  Resp: 15 18 18 18   Height:  5\' 11"  (1.803 m)    Weight:  117.5 kg (259 lb 0.7 oz)  117.4 kg (258 lb 13.1 oz)  SpO2: 98% 98% 99% 90%   I&O's:   Intake/Output Summary (Last 24 hours) at 07/02/12 0946 Last data filed at 07/02/12 0900  Gross per 24 hour  Intake   1020 ml  Output   1150 ml  Net   -130 ml   TELEMETRY: Reviewed telemetry pt in NSR:     PHYSICAL EXAM General: Well developed, well nourished, in no acute distress Head: Eyes PERRLA, No xanthomas.   Normal cephalic and atramatic  Lungs:   scattered rhonchi Heart:   HRRR S1 S2 Pulses are 2+ & equal. Abdomen: Bowel sounds are positive, abdomen soft and non-tender without masses Extremities:   No clubbing, cyanosis or edema.  DP +1 Neuro: Alert and oriented X 3. Psych:  Good affect, responds appropriately   LABS: Basic Metabolic Panel:  Recent Labs  96/04/54 1732 07/01/12 0535 07/02/12 0545  NA  --  137 137  K  --  3.6 3.5  CL  --  101 102  CO2  --  25 25  GLUCOSE  --  115* 137*  BUN  --  30* 33*  CREATININE  --  1.45* 1.57*  CALCIUM  --  9.4 9.3  MG 2.0  --   --    Liver Function Tests:  Recent Labs  06/30/12 1250  AST 25  ALT 18  ALKPHOS 104  BILITOT 1.3*  PROT 8.1  ALBUMIN 3.0*   No results found for this basename: LIPASE, AMYLASE,  in the last 72 hours CBC:  Recent Labs  06/30/12 1250  WBC 8.3  NEUTROABS 7.0  HGB 13.6  HCT 40.8  MCV 86.1  PLT 163   Cardiac Enzymes:  Recent Labs  06/30/12 1733 07/01/12 07/01/12 0535  TROPONINI <0.30 <0.30 <0.30   BNP: No components found with this basename: POCBNP,  D-Dimer: No results found for this basename: DDIMER,  in the last 72  hours Hemoglobin A1C:  Recent Labs  06/30/12 1732  HGBA1C 6.5*   Fasting Lipid Panel: No results found for this basename: CHOL, HDL, LDLCALC, TRIG, CHOLHDL, LDLDIRECT,  in the last 72 hours Thyroid Function Tests:  Recent Labs  06/30/12 1732  TSH 2.236   Anemia Panel: No results found for this basename: VITAMINB12, FOLATE, FERRITIN, TIBC, IRON, RETICCTPCT,  in the last 72 hours Coag Panel:   Lab Results  Component Value Date   INR 2.41* 07/02/2012   INR 2.43* 07/01/2012   INR 2.71* 06/30/2012    RADIOLOGY: Dg Chest 2 View  06/30/2012  *RADIOLOGY REPORT*  Clinical Data: Shortness of breath, cough  CHEST - 2 VIEW  Comparison: 06/27/2012  Findings: Cardiomegaly again noted.  Status post CABG.  There is mild interstitial prominence right lung.  Findings suspicious for asymmetric edema or asymmetric airspace disease.  Clinical correlation is necessary.  No segmental infiltrate.  IMPRESSION: Status post CABG.  There is mild interstitial prominence right lung.  Findings suspicious for asymmetric edema or asymmetric airspace  disease.  Clinical correlation is necessary.  No segmental infiltrate.   Original Report Authenticated By: Natasha Mead, M.D.    Dg Chest 2 View  06/27/2012  *RADIOLOGY REPORT*  Clinical Data: Cough, shortness of breath.  CHEST - 2 VIEW  Comparison: 08/23/2011  Findings: Prior CABG.  Cardiomegaly with vascular congestion. Interstitial prominence may reflect mild interstitial edema.  No confluent opacities or effusions.  IMPRESSION: Cardiomegaly vascular congestion and possible mild interstitial edema.   Original Report Authenticated By: Charlett Nose, M.D.    ASSESSMENT:  1. Chronic systolic heart failure with acute decompensation. Baseline LVEF in 2013 was 10-15% - thus far he is net out 1.4L and lungs still sound junky on exam 2. Diabetes mellitus  3. Stage III chronic kidney disease - stable with diuresis  4. History of paroxysmal atrial fibrillation on chronic amiodarone  suppression maintaining NSR  5. Chronic Coumadin therapy  Plan:  1. IV Lasix and recheck BNP 2. Continue Coumadin  3. Check ESR to rule out any component of amio lung toxicity  4. Watch renal function closely  5.  Chest xray today - lungs sound junky and he has a productive cough     Quintella Reichert, MD  07/02/2012  9:46 AM

## 2012-07-03 LAB — BASIC METABOLIC PANEL
BUN: 29 mg/dL — ABNORMAL HIGH (ref 6–23)
Calcium: 9.5 mg/dL (ref 8.4–10.5)
Chloride: 103 mEq/L (ref 96–112)
Creatinine, Ser: 1.53 mg/dL — ABNORMAL HIGH (ref 0.50–1.35)
GFR calc Af Amer: 50 mL/min — ABNORMAL LOW (ref >90)
GFR calc non Af Amer: 43 mL/min — ABNORMAL LOW (ref >90)

## 2012-07-03 LAB — PROTIME-INR: Prothrombin Time: 24.4 seconds — ABNORMAL HIGH (ref 11.6–15.2)

## 2012-07-03 LAB — GLUCOSE, CAPILLARY: Glucose-Capillary: 113 mg/dL — ABNORMAL HIGH (ref 70–99)

## 2012-07-03 MED ORDER — METOLAZONE 2.5 MG PO TABS
2.5000 mg | ORAL_TABLET | Freq: Every day | ORAL | Status: AC
  Administered 2012-07-03: 2.5 mg via ORAL
  Filled 2012-07-03: qty 1

## 2012-07-03 MED ORDER — FUROSEMIDE 10 MG/ML IJ SOLN
120.0000 mg | Freq: Three times a day (TID) | INTRAVENOUS | Status: DC
  Administered 2012-07-03 – 2012-07-04 (×3): 120 mg via INTRAVENOUS
  Filled 2012-07-03 (×4): qty 12

## 2012-07-03 NOTE — Progress Notes (Signed)
Utilization Review Completed Malikhi Ogan J. Teisha Trowbridge, RN, BSN, NCM [phone]  

## 2012-07-03 NOTE — Consult Note (Signed)
WOC consult Note Reason for Consult:  Requested for soreness/redness to buttock Wound type: Patchy areas of partial thickness skin loss; appearance consistent with moisture acquired skin damage Pressure Ulcer POA: This is not a pressure ulcer Measurement: Fissure to gluteal cliff 0.8cm X 0.1 X 0.1cm.  Scattered small open areas, skin peeling/dry.   Dressing procedure/placement/frequency:  Patient is incontinent of stool at times.  Barrier cream to protect and repel moisture PRN with turns and cleaning. Norva Karvonen, RN MSN-student Please re-consult if further assistance is needed.  Thank-you,  Cammie Mcgee MSN, RN, CWOCN, [place], CNS [phone]

## 2012-07-03 NOTE — Progress Notes (Signed)
ANTICOAGULATION CONSULT NOTE - Follow Up Consult  Pharmacy Consult for Coumadin Indication: atrial fibrillation  Allergies  Allergen Reactions  . Other     Ear drops  I used made my throat feel funny     Patient Measurements: Height: 5\' 11"  (180.3 cm) Weight: 254 lb (115.214 kg) (bed scale) IBW/kg (Calculated) : 75.3  Vital Signs: Temp: 98.1 F (36.7 C) (05/05 0501) Temp src: Oral (05/05 0501) BP: 115/52 mmHg (05/05 0501) Pulse Rate: 61 (05/05 0501)  Labs:  Recent Labs  06/30/12 1250 06/30/12 1733 07/01/12 07/01/12 0535 07/02/12 0545 07/03/12 0500  HGB 13.6  --   --   --   --   --   HCT 40.8  --   --   --   --   --   PLT 163  --   --   --   --   --   LABPROT 27.4*  --   --  25.3* 25.1* 24.4*  INR 2.71*  --   --  2.43* 2.41* 2.32*  CREATININE 1.50*  --   --  1.45* 1.57* 1.53*  TROPONINI  --  <0.30 <0.30 <0.30  --   --     Estimated Creatinine Clearance: 53.9 ml/min (by C-G formula based on Cr of 1.53).  Assessment: 77 yom continues on coumadin for history of afib. INR remains at goal at 2.32 on home regimen of 2.5mg  daily. No new CBC today, no bleeding noted.   Goal of Therapy:  INR 2-3   Plan:  1. Continue coumadin 2.5mg  PO daily 2. F/u AM INR  Lysle Pearl, PharmD, BCPS Pager # [phone] 07/03/2012 9:44 AM

## 2012-07-03 NOTE — Progress Notes (Signed)
Patient Name: [name] Date of Encounter: 07/03/2012    SUBJECTIVE:Still has cough. Feels only slightly better than on admission.  TELEMETRY:  SB Filed Vitals:   07/01/12 2022 07/02/12 0429 07/02/12 2015 07/03/12 0501  BP: 106/74 115/68 126/68 115/52  Pulse: 58 73 58 61  Temp: 98.2 F (36.8 C) 97.9 F (36.6 C) 98.4 F (36.9 C) 98.1 F (36.7 C)  TempSrc: Oral Oral Oral Oral  Resp: 18 18 17 20   Height:      Weight:  117.4 kg (258 lb 13.1 oz)  115.214 kg (254 lb)  SpO2: 99% 90% 99% 97%    Intake/Output Summary (Last 24 hours) at 07/03/12 0807 Last data filed at 07/03/12 0422  Gross per 24 hour  Intake    420 ml  Output   1075 ml  Net   -655 ml    LABS: Basic Metabolic Panel:  Recent Labs  21/30/86 1732  07/02/12 0545 07/03/12 0500  NA  --   < > 137 140  K  --   < > 3.5 3.6  CL  --   < > 102 103  CO2  --   < > 25 24  GLUCOSE  --   < > 137* 109*  BUN  --   < > 33* 29*  CREATININE  --   < > 1.57* 1.53*  CALCIUM  --   < > 9.3 9.5  MG 2.0  --   --   --   < > = values in this interval not displayed. CBC:  Recent Labs  06/30/12 1250  WBC 8.3  NEUTROABS 7.0  HGB 13.6  HCT 40.8  MCV 86.1  PLT 163   Cardiac Enzymes:  Recent Labs  06/30/12 1733 07/01/12 07/01/12 0535  TROPONINI <0.30 <0.30 <0.30   BNP (last 3 results)  Recent Labs  06/27/12 1150 06/30/12 1250 07/02/12 1030  PROBNP 3605.0* 4037.0* 2736.0*    Radiology/Studies:  07/02/12 - still CHF  Physical Exam: Blood pressure 115/52, pulse 61, temperature 98.1 F (36.7 C), temperature source Oral, resp. rate 20, height 5\' 11"  (1.803 m), weight 115.214 kg (254 lb), SpO2 97.00%. Weight change: -2.286 kg (-5 lb 0.7 oz)   Marked obesity and bed bound.  No evidence of respiratory distress.  No obvious edema.   ASSESSMENT:  1. Acute on chronic systolic heart failure(EF 15% last June) with disappointing diuresis to this point.  2. Physical deconditioning and bed bound  3. Acute on  chronic kidney injury  4. H/o PAF   Plan:  1. Add metolazone 2. Increase lasix 3. HF team consult. 4. Poor prognosis 5. Skin team consult.  [name] W 07/03/2012, 8:07 AM

## 2012-07-03 NOTE — Care Management Note (Signed)
    Page 1 of 2   07/03/2012     2:40:16 PM   CARE MANAGEMENT NOTE 07/03/2012  Patient:  [name], [name]   Account Number:  [id number]  Date Initiated:  07/01/2012  Documentation initiated by:  DAVIS,TYMEEKA  Subjective/Objective Assessment:   78 yo male admitted with CHF. PTA pt lived home with spouse     Action/Plan:   home when stable   Anticipated DC Date:  07/06/2012   Anticipated DC Plan:  HOME W HOME HEALTH SERVICES      DC Planning Services  CM consult      Choice offered to / List presented to:  NA        HH arranged  HH-1 RN      [facility] agency  [facility]   Status of service:  In process, will continue to follow Medicare Important Message given?   (If response is "NO", the following Medicare IM given date fields will be blank) Date Medicare IM given:   Date Additional Medicare IM given:    Discharge Disposition:    Per UR Regulation:  Reviewed for med. necessity/level of care/duration of stay  If discussed at Long Length of Stay Meetings, dates discussed:    Comments:  07/03/12.Marland KitchenMarland KitchenOletta Cohn, RN, BSN, [employer] [phone] Spoke with pt to confirm [facility]  for discharge planning. Debbie of [facility] notified and note in to MD for resumption orders.  Sharmon Leyden, RN Registered Nurse Signed CASE MANAGEMENT Care Management Note Service date: 07/01/2012 7:03 PM Cm spoke with patient with multiple family members present at bedside concerning dc planning. Pt states previously active with [facility] for [facility] services. Pt states request HHRN upon discharge but no HHPT. Pt lives home with spouse. Awaiting further assessment of needs upon discharge.

## 2012-07-04 LAB — BASIC METABOLIC PANEL
BUN: 30 mg/dL — ABNORMAL HIGH (ref 6–23)
CO2: 25 mEq/L (ref 19–32)
Chloride: 99 mEq/L (ref 96–112)
Creatinine, Ser: 1.62 mg/dL — ABNORMAL HIGH (ref 0.50–1.35)
GFR calc Af Amer: 46 mL/min — ABNORMAL LOW (ref >90)

## 2012-07-04 LAB — GLUCOSE, CAPILLARY
Glucose-Capillary: 134 mg/dL — ABNORMAL HIGH (ref 70–99)
Glucose-Capillary: 159 mg/dL — ABNORMAL HIGH (ref 70–99)

## 2012-07-04 LAB — PROTIME-INR: Prothrombin Time: 25.1 seconds — ABNORMAL HIGH (ref 11.6–15.2)

## 2012-07-04 MED ORDER — SPIRONOLACTONE 25 MG PO TABS
25.0000 mg | ORAL_TABLET | Freq: Every day | ORAL | Status: DC
Start: 2012-07-05 — End: 2012-07-05
  Filled 2012-07-04: qty 1

## 2012-07-04 MED ORDER — FUROSEMIDE 80 MG PO TABS
120.0000 mg | ORAL_TABLET | Freq: Every day | ORAL | Status: DC
  Filled 2012-07-04: qty 1

## 2012-07-04 MED ORDER — FUROSEMIDE 10 MG/ML IJ SOLN: 120.0000 mg | Freq: Two times a day (BID) | INTRAVENOUS | Status: DC

## 2012-07-04 MED ORDER — METOLAZONE 2.5 MG PO TABS
2.5000 mg | ORAL_TABLET | Freq: Once | ORAL | Status: AC
  Administered 2012-07-04: 2.5 mg via ORAL
  Filled 2012-07-04: qty 1

## 2012-07-04 MED ORDER — POTASSIUM CHLORIDE CRYS ER 20 MEQ PO TBCR
40.0000 meq | EXTENDED_RELEASE_TABLET | Freq: Two times a day (BID) | ORAL | Status: AC
  Administered 2012-07-04 (×2): 40 meq via ORAL
  Filled 2012-07-04: qty 2

## 2012-07-04 MED ORDER — FUROSEMIDE 10 MG/ML IJ SOLN
120.0000 mg | Freq: Once | INTRAVENOUS | Status: AC
  Administered 2012-07-04: 120 mg via INTRAVENOUS
  Filled 2012-07-04: qty 12

## 2012-07-04 MED ORDER — POTASSIUM CHLORIDE CRYS ER 20 MEQ PO TBCR
EXTENDED_RELEASE_TABLET | ORAL | Status: AC
  Filled 2012-07-04: qty 1

## 2012-07-04 NOTE — Progress Notes (Signed)
ANTICOAGULATION CONSULT NOTE - Follow Up Consult  Pharmacy Consult for Coumadin Indication: atrial fibrillation  Allergies  Allergen Reactions  . Other     Ear drops  I used made my throat feel funny     Patient Measurements: Height: 5\' 11"  (180.3 cm) Weight: 245 lb 11.2 oz (111.449 kg) (no standing WT) IBW/kg (Calculated) : 75.3  Vital Signs: Temp: 98.2 F (36.8 C) (05/06 0632) Temp src: Oral (05/06 0632) BP: 117/78 mmHg (05/06 0632) Pulse Rate: 58 (05/06 0632)  Labs:  Recent Labs  07/02/12 0545 07/03/12 0500 07/04/12 0430  LABPROT 25.1* 24.4* 25.1*  INR 2.41* 2.32* 2.41*  CREATININE 1.57* 1.53* 1.62*    Estimated Creatinine Clearance: 50 ml/min (by C-G formula based on Cr of 1.62).  Assessment: 77 yom continues on coumadin for history of afib. INR remains at goal at 2.41 on home regimen of 2.5mg  daily. No new CBC today, no bleeding noted.   Goal of Therapy:  INR 2-3   Plan:  1. Continue coumadin 2.5mg  PO daily 2. F/u AM INR  Lysle Pearl, PharmD, BCPS Pager # [phone] 07/04/2012 8:44 AM

## 2012-07-04 NOTE — Progress Notes (Addendum)
Patient Name: [name] Date of Encounter: 07/04/2012    SUBJECTIVE: Mr. Gervin feels her. Cough is improved. Breathing has improved. We had a prolonged conversation about end-of-life and advanced directives. His daughter was present during the entire conversation.  TELEMETRY:  Sinus bradycardia with first-degree AV block Filed Vitals:   07/03/12 1051 07/03/12 1426 07/03/12 2100 07/04/12 0632  BP: 107/68 119/71 113/68 117/78  Pulse: 67 58 59 58  Temp:  97.8 F (36.6 C) 98.2 F (36.8 C) 98.2 F (36.8 C)  TempSrc:  Oral Oral Oral  Resp: 18 18 18 18   Height:      Weight:    111.449 kg (245 lb 11.2 oz)  SpO2: 96% 98% 98% 95%    Intake/Output Summary (Last 24 hours) at 07/04/12 1034 Last data filed at 07/04/12 0939  Gross per 24 hour  Intake   1146 ml  Output   3100 ml  Net  -1954 ml    LABS: Basic Metabolic Panel:  Recent Labs  16/10/96 0500 07/04/12 0430  NA 140 138  K 3.6 3.2*  CL 103 99  CO2 24 25  GLUCOSE 109* 124*  BUN 29* 30*  CREATININE 1.53* 1.62*  CALCIUM 9.5 9.7    Radiology/Studies:  No new data  Physical Exam: Blood pressure 117/78, pulse 58, temperature 98.2 F (36.8 C), temperature source Oral, resp. rate 18, height 5\' 11"  (1.803 m), weight 111.449 kg (245 lb 11.2 oz), SpO2 95.00%. Weight change: -3.765 kg (-8 lb 4.8 oz)   Markedly obese lying supine in no distress  Regular rhythm. No obvious gallop.  Large non-distended non-tender abdomen  No peripheral edema. Slight presacral edema   ASSESSMENT:  1. Systolic heart failure, acute on chronic, improving with diuresis  2. Acute kidney injury, secondary to low cardiac output and diuresis.  3. Coronary atherosclerotic heart disease, advanced with bypass graft failure. No anginal symptoms  4. Morbid obesity, with patient essentially bedridden   Plan:   1. Long discussion concerning advanced directives and CODE STATUS. Patient has agreed to no CODE STATUS. Daughter present during the  discussion.  2. Converted to oral medical regimen  3. Discharge next 24-48 hours on updated at oral medical regimen.  45 minute visit including discussion about CODE STATUS  Signed, Lesleigh Noe 07/04/2012, 10:34 AM

## 2012-07-05 LAB — GLUCOSE, CAPILLARY
Glucose-Capillary: 134 mg/dL — ABNORMAL HIGH (ref 70–99)
Glucose-Capillary: 154 mg/dL — ABNORMAL HIGH (ref 70–99)
Glucose-Capillary: 135 mg/dL — ABNORMAL HIGH (ref 70–99)

## 2012-07-05 LAB — BASIC METABOLIC PANEL WITH GFR
BUN: 39 mg/dL — ABNORMAL HIGH (ref 6–23)
CO2: 26 meq/L (ref 19–32)
Calcium: 9.9 mg/dL (ref 8.4–10.5)
Chloride: 98 meq/L (ref 96–112)
Creatinine, Ser: 1.95 mg/dL — ABNORMAL HIGH (ref 0.50–1.35)
GFR calc Af Amer: 37 mL/min — ABNORMAL LOW
GFR calc non Af Amer: 32 mL/min — ABNORMAL LOW
Glucose, Bld: 122 mg/dL — ABNORMAL HIGH (ref 70–99)
Potassium: 3.7 meq/L (ref 3.5–5.1)
Sodium: 135 meq/L (ref 135–145)

## 2012-07-05 LAB — PROTIME-INR
INR: 2.48 — ABNORMAL HIGH (ref 0.00–1.49)
Prothrombin Time: 25.7 seconds — ABNORMAL HIGH (ref 11.6–15.2)

## 2012-07-05 MED ORDER — TORSEMIDE 20 MG PO TABS
60.0000 mg | ORAL_TABLET | Freq: Every day | ORAL | Status: DC
  Administered 2012-07-07: 60 mg via ORAL
  Filled 2012-07-05: qty 3

## 2012-07-05 MED ORDER — TORSEMIDE 20 MG PO TABS
80.0000 mg | ORAL_TABLET | Freq: Every day | ORAL | Status: DC
  Filled 2012-07-05: qty 4

## 2012-07-05 MED ORDER — SPIRONOLACTONE 25 MG PO TABS
25.0000 mg | ORAL_TABLET | Freq: Two times a day (BID) | ORAL | Status: DC
  Filled 2012-07-05 (×3): qty 1

## 2012-07-05 MED ORDER — SPIRONOLACTONE 25 MG PO TABS
25.0000 mg | ORAL_TABLET | Freq: Every day | ORAL | Status: DC
  Administered 2012-07-07: 25 mg via ORAL
  Filled 2012-07-05: qty 1

## 2012-07-05 NOTE — Progress Notes (Addendum)
Patient Name: [name] Date of Encounter: 07/05/2012    SUBJECTIVE: Today is the switch to oral diuretic regimen. No real change in overall clinical condition. Labs pending.  TELEMETRY:   Sinus bradycardia Filed Vitals:   07/03/12 2100 07/04/12 0632 07/04/12 1347 07/05/12 0629  BP: 113/68 117/78 92/60 107/66  Pulse: 59 58 62 57  Temp: 98.2 F (36.8 C) 98.2 F (36.8 C) 98.1 F (36.7 C) 97.8 F (36.6 C)  TempSrc: Oral Oral Oral Oral  Resp: 18 18 17 18   Height:      Weight:  111.449 kg (245 lb 11.2 oz)  110.224 kg (243 lb)  SpO2: 98% 95% 97% 100%    Intake/Output Summary (Last 24 hours) at 07/05/12 0726 Last data filed at 07/05/12 1610  Gross per 24 hour  Intake   1280 ml  Output   1620 ml  Net   -340 ml    LABS: Basic Metabolic Panel:  Recent Labs  96/04/54 0430 07/05/12 0555  NA 138 135  K 3.2* 3.7  CL 99 98  CO2 25 26  GLUCOSE 124* 122*  BUN 30* 39*  CREATININE 1.62* 1.95*  CALCIUM 9.7 9.9     Radiology/Studies:  No new data  Physical Exam: Blood pressure 107/66, pulse 57, temperature 97.8 F (36.6 C), temperature source Oral, resp. rate 18, height 5\' 11"  (1.803 m), weight 110.224 kg (243 lb), SpO2 100.00%. Weight change: -1.225 kg (-2 lb 11.2 oz)   Obese Chest clear anterior Abdomen is obese  ASSESSMENT:  1. Acute on chronic systolic heart failure, improved 2. Diabetes mellitus 3. Hypertension 4. Chronic kidney disease with acute injury component, creat up to 1.95 5. Hypokalemia  Plan:  1. Switch to oral diuretic tomorrow AM 2. Plan d/c tomorrow PM or Friday AM if all goes well with renal function 3. Fluid restrict 4. Check potassium today  Signed, Lesleigh Noe 07/05/2012, 7:26 AM

## 2012-07-05 NOTE — Progress Notes (Signed)
ANTICOAGULATION CONSULT NOTE - Follow Up Consult  Pharmacy Consult for Coumadin Indication: atrial fibrillation  Allergies  Allergen Reactions  . Other     Ear drops  I used made my throat feel funny     Patient Measurements: Height: 5\' 11"  (180.3 cm) Weight: 243 lb (110.224 kg) (bed scale) IBW/kg (Calculated) : Dalton.3  Vital Signs: Temp: 97.8 F (36.6 C) (05/07 0629) Temp src: Oral (05/07 0629) BP: 107/66 mmHg (05/07 0629) Pulse Rate: 57 (05/07 0629)  Labs:  Recent Labs  07/03/12 0500 07/04/12 0430 07/05/12 0555 07/05/12 0905  LABPROT 24.4* 25.1*  --  25.7*  INR 2.32* 2.41*  --  2.48*  CREATININE 1.53* 1.62* 1.95*  --     Estimated Creatinine Clearance: 41.3 ml/min (by C-G formula based on Cr of 1.95).  Assessment: [name] continues on coumadin for history of afib. INR remains at goal at 2.48 on home regimen of 2.5mg  daily. No new CBC today, no bleeding noted.   Goal of Therapy:  INR 2-3   Plan:  1. Continue coumadin 2.5mg  PO daily 2. Change INR to MWF checks only since INR has been so stable on his home regimen  Lysle Pearl, PharmD, BCPS Pager # [phone] 07/05/2012 9:57 AM

## 2012-07-06 LAB — BASIC METABOLIC PANEL
BUN: 43 mg/dL — ABNORMAL HIGH (ref 6–23)
CO2: 22 mEq/L (ref 19–32)
Calcium: 9.4 mg/dL (ref 8.4–10.5)
Chloride: 96 mEq/L (ref 96–112)
Creatinine, Ser: 2.01 mg/dL — ABNORMAL HIGH (ref 0.50–1.35)
Glucose, Bld: 126 mg/dL — ABNORMAL HIGH (ref 70–99)

## 2012-07-06 LAB — GLUCOSE, CAPILLARY
Glucose-Capillary: 139 mg/dL — ABNORMAL HIGH (ref 70–99)
Glucose-Capillary: 136 mg/dL — ABNORMAL HIGH (ref 70–99)

## 2012-07-06 NOTE — Discharge Summary (Signed)
Patient ID: [name] MRN: [id number] DOB/AGE: April 29, 1934 78 y.o.  Admit date: 06/30/2012 Discharge date: 07/07/2012  Patient Active Problem List   Diagnosis Date Noted  . Acute on chronic systolic congestive heart failure 06/30/2012    Priority: High    Class: Acute  . Morbid obesity 06/30/2012    Priority: Medium    Class: Chronic  . CKD (chronic kidney disease), stage III 06/30/2012    Priority: Medium    Class: Chronic  . Gout 06/30/2012    Priority: Low    Class: Chronic  . Paroxysmal atrial fibrillation 06/30/2012    Priority: Low    Class: Chronic  . Cerebral arterial embolism 06/30/2012    Priority: Low    Class: Chronic  . Chest pain 08/22/2011   Primary Discharge Diagnosis: Acute on chronic systolic heart failure, improved with diuresis. 2013 ejection fraction 15%. Not repeated this hospital stay.  Secondary Discharge Diagnosis: Ischemic cardiomyopathy  Coronary artery disease with prior bypass grafting, remote  DO NOT RESUSCITATE  Paroxysmal atrial fibrillation, suppressed with amiodarone  Chronic kidney disease, with acute decompensation due to diuresis, improved at discharge  Embolic CVA, remote, secondary to atrial fibrillation  Morbid obesity, with patient refusal to attempt ambulation and or get out of bed  Significant Diagnostic Studies: None  Consults: None  Hospital Course: Mr. Mcenery is well known to me. He was admitted to the emergency room after returning with complaint of cough and dyspnea. Prior to admission he had been seen in the emergency room 3 days earlier but refused admission. Chest x-ray and BNP on both occasions were consistent with decompensated heart failure.  After consenting to admission, IV furosemide 120 mg every 8 hours and several doses of oral metolazone were used to establish diuresis. We will able to achieve a 4.5 L diuresis but he developed acute kidney injury with creatinine rising from 1.65  to 2.01 on the day  prior to discharge. On the morning of discharge the creat is 1.93 and potassium 3.2 (was supplimented prior to dsichrge. He will have OV and BMET in 5 days).  He is discharged on torsemide and low-dose Aldactone which are intensifications of his previous diuretic regimen.  The plan will be to see the patient in 3-5 days after discharge with blood work to ensure stability.  Prolonged discharge planning and conference with family was required.   The patient is established as DNR.   Discharge Exam: Blood pressure 124/78, pulse 64, temperature 98.2 F (36.8 C), temperature source Oral, resp. rate 18, height 5\' 11"  (1.803 m), weight 109.6 kg (241 lb 10 oz), SpO2 98.00%.    Markedly obese, lying at approximately 15 in bed. No distress.NeckNeck veins areCardiac exam reveals distant  heart tones. No obvious murmur.Abdomen is obese and nontender .No peripheral edema.  difficult to determine if there is  prressaacral edemaNeurological exam is sis stable.  Labs:   Lab Results  Component Value Date   WBC 8.3 06/30/2012   HGB 13.6 06/30/2012   HCT 40.8 06/30/2012   MCV 86.1 06/30/2012   PLT 163 06/30/2012    Recent Labs Lab 06/30/12 1250  07/07/12 0540  NA 139  < > 136  K 3.7  < > 3.2*  CL 102  < > 99  CO2 25  < > 25  BUN 28*  < > 42*  CREATININE 1.50*  < > 1.93*  CALCIUM 9.3  < > 9.0  PROT 8.1  --   --  BILITOT 1.3*  --   --   ALKPHOS 104  --   --   ALT 18  --   --   AST 25  --   --   GLUCOSE 143*  < > 143*  < > = values in this interval not displayed. Lab Results  Component Value Date   CKTOTAL 42 08/23/2011   CKMB 1.7 08/23/2011   TROPONINI <0.30 07/01/2012    Lab Results  Component Value Date   CHOL 85 08/23/2011   CHOL  Value: 68        ATP III CLASSIFICATION:  <200     mg/dL   Desirable  865-784  mg/dL   Borderline High  >=696    mg/dL   High        2/95/2841   CHOL  Value: 91        ATP III CLASSIFICATION:  <200     mg/dL   Desirable  324-401  mg/dL   Borderline High  >=027     mg/dL   High 2/53/6644   Lab Results  Component Value Date   HDL 32* 08/23/2011   HDL 21* 05/23/2008   HDL 24* 07/28/2007   Lab Results  Component Value Date   LDLCALC 36 08/23/2011   LDLCALC  Value: 34        Total Cholesterol/HDL:CHD Risk Coronary Heart Disease Risk Table                     Men   Women  1/2 Average Risk   3.4   3.3  Average Risk       5.0   4.4  2 X Average Risk   9.6   7.1  3 X Average Risk  23.4   11.0        Use the calculated Patient Ratio above and the CHD Risk Table to determine the patient's CHD Risk.        ATP III CLASSIFICATION (LDL):  <100     mg/dL   Optimal  034-742  mg/dL   Near or Above                    Optimal  130-159  mg/dL   Borderline  595-638  mg/dL   High  >756     mg/dL   Very High 4/33/2951   LDLCALC  Value: 46        Total Cholesterol/HDL:CHD Risk Coronary Heart Disease Risk Table                     Men   Women  1/2 Average Risk   3.4   3.3 07/28/2007   Lab Results  Component Value Date   TRIG 83 08/23/2011   TRIG 66 05/23/2008   TRIG 107 07/28/2007   Lab Results  Component Value Date   CHOLHDL 2.7 08/23/2011   CHOLHDL 3.2 05/23/2008   CHOLHDL 3.8 07/28/2007   No results found for this basename: LDLDIRECT      Radiology: Admitting chest x-ray revealed pulmonary interstitial edema  EKG: Admitting EKG demonstrated sinus bradycardia with evidence of old anterior MI, and interventricular conduction delay  FOLLOW UP PLANS AND APPOINTMENTS    Medication List    STOP taking these medications       benzonatate 100 MG capsule  Commonly known as:  TESSALON     furosemide 80 MG tablet  Commonly known as:  LASIX  TAKE these medications       amiodarone 200 MG tablet  Commonly known as:  PACERONE  Take 200 mg by mouth daily.     aspirin EC 81 MG tablet  Take 81 mg by mouth daily.     atorvastatin 40 MG tablet  Commonly known as:  LIPITOR  Take 40 mg by mouth at bedtime.     clonazePAM 0.5 MG tablet  Commonly known as:  KLONOPIN    Take 0.5 mg by mouth at bedtime as needed. For anxiety     colchicine 0.6 MG tablet  Take 0.5 tablets (0.3 mg total) by mouth daily.     hydrALAZINE 10 MG tablet  Commonly known as:  APRESOLINE  Take 1 tablet (10 mg total) by mouth every 8 (eight) hours.     isosorbide mononitrate 60 MG 24 hr tablet  Commonly known as:  IMDUR  Take 60 mg by mouth daily.     latanoprost 0.005 % ophthalmic solution  Commonly known as:  XALATAN  Place 1 drop into both eyes at bedtime.     metoprolol tartrate 25 MG tablet  Commonly known as:  LOPRESSOR  Take 25 mg by mouth 2 (two) times daily.     potassium chloride 10 MEQ tablet  Commonly known as:  K-DUR,KLOR-CON  Take 1 tablet (10 mEq total) by mouth daily.  Start taking on:  07/08/2012     spironolactone 25 MG tablet  Commonly known as:  ALDACTONE  Take 1 tablet (25 mg total) by mouth daily.     torsemide 20 MG tablet  Commonly known as:  DEMADEX  Take 3 tablets (60 mg total) by mouth daily.     ULORIC 80 MG Tabs  Generic drug:  Febuxostat  Take 1 tablet by mouth daily.     warfarin 5 MG tablet  Commonly known as:  COUMADIN  Take 0.5 tablets (2.5 mg total) by mouth daily. DO NOT TAKE COUMADIN UNTIL ADVISED BY DR. [facility].       Follow-up Information   Follow up with Lesleigh Noe, MD On 07/12/2012. (11 AM and blood work for kidney function)    Contact information:   [address] [place] [address] [phone]       BRING ALL MEDICATIONS WITH YOU TO FOLLOW UP APPOINTMENTS  Time spent with patient to include physician time:  35 minutes Signed: Lesleigh Noe 07/07/2012, 7:55 AM

## 2012-07-06 NOTE — Plan of Care (Signed)
Problem: Phase I Progression Outcomes Goal: Up in chair, BRP Outcome: Not Progressing Pt refused Goal: Voiding-avoid urinary catheter unless indicated Outcome: Progressing Condom cath

## 2012-07-06 NOTE — Progress Notes (Signed)
Chaplain responded to request for Advance Directive packet for this pt. Delivered AD packet to pt and gave brief explanation. Pt wanted to review it with his daughter when she returned and then call us if any questions.

## 2012-07-06 NOTE — Progress Notes (Signed)
Pt refused to be repositioned and to get up in chair, he did allow his family to reposition him, times one during the 12 hour shift. Will continue to monitor.

## 2012-07-06 NOTE — Progress Notes (Signed)
Patient Name: [name] Date of Encounter: 07/06/2012    SUBJECTIVE: Feels better. There is no chest pain and cough has decreased.  TELEMETRY:  NSR with SB Filed Vitals:   07/05/12 1500 07/05/12 1900 07/05/12 2116 07/06/12 0552  BP: 94/58 109/74 117/77 113/98  Pulse: 55 57 60 58  Temp: 97.6 F (36.4 C) 97.8 F (36.6 C) 98.2 F (36.8 C) 97.6 F (36.4 C)  TempSrc: Oral Oral Oral Oral  Resp: 18 18 18 18   Height:      Weight:    109.8 kg (242 lb 1 oz)  SpO2: 99% 92% 99% 98%    Intake/Output Summary (Last 24 hours) at 07/06/12 0924 Last data filed at 07/06/12 0853  Gross per 24 hour  Intake    843 ml  Output    350 ml  Net    493 ml    LABS: Basic Metabolic Panel:  Recent Labs  91/47/82 0555 07/06/12 0605  NA 135 134*  K 3.7 3.5  CL 98 96  CO2 26 22  GLUCOSE 122* 126*  BUN 39* 43*  CREATININE 1.95* 2.01*  CALCIUM 9.9 9.4     Radiology/Studies:  07/02/12 Mild bilateral; edema not greatly different then before.  Physical Exam: Blood pressure 113/98, pulse 58, temperature 97.6 F (36.4 C), temperature source Oral, resp. rate 18, height 5\' 11"  (1.803 m), weight 109.8 kg (242 lb 1 oz), SpO2 98.00%. Weight change: -0.424 kg (-15 oz)   Chest is clear. Cardiac exam is unremarkable. Neuro is unchanged.  ASSESSMENT:  1. Acute kidney injury resolving/plateauing, with diuretics on board. 2. Acute systolic heart failure is improved. 3. Anticoagulation  Plan:  1. Hold diuretic today, and reassess renal function in AM. 2. Repeat CXR 3. Home in AM. 4. DNR  Signed, Lesleigh Noe 07/06/2012, 9:24 AM

## 2012-07-07 LAB — BASIC METABOLIC PANEL
BUN: 42 mg/dL — ABNORMAL HIGH (ref 6–23)
CO2: 25 mEq/L (ref 19–32)
Chloride: 99 mEq/L (ref 96–112)
GFR calc Af Amer: 37 mL/min — ABNORMAL LOW (ref >90)
Potassium: 3.2 mEq/L — ABNORMAL LOW (ref 3.5–5.1)

## 2012-07-07 LAB — PROTIME-INR: Prothrombin Time: 25.5 seconds — ABNORMAL HIGH (ref 11.6–15.2)

## 2012-07-07 LAB — GLUCOSE, CAPILLARY: Glucose-Capillary: 146 mg/dL — ABNORMAL HIGH (ref 70–99)

## 2012-07-07 MED ORDER — POTASSIUM CHLORIDE CRYS ER 10 MEQ PO TBCR: 10.0000 meq | EXTENDED_RELEASE_TABLET | Freq: Every day | ORAL | Status: DC

## 2012-07-07 MED ORDER — TORSEMIDE 20 MG PO TABS: 20.0000 mg | ORAL_TABLET | Freq: Every day | ORAL | Status: DC

## 2012-07-07 MED ORDER — TORSEMIDE 20 MG PO TABS: 60.0000 mg | ORAL_TABLET | Freq: Every day | ORAL | Status: DC

## 2012-07-07 MED ORDER — SPIRONOLACTONE 25 MG PO TABS: 25.0000 mg | ORAL_TABLET | Freq: Every day | ORAL | Status: DC

## 2012-07-07 MED ORDER — POTASSIUM CHLORIDE CRYS ER 20 MEQ PO TBCR
40.0000 meq | EXTENDED_RELEASE_TABLET | Freq: Two times a day (BID) | ORAL | Status: DC
Start: 2012-07-07 — End: 2012-07-07
  Administered 2012-07-07: 40 meq via ORAL

## 2012-07-07 NOTE — Plan of Care (Signed)
Problem: Consults Goal: Heart Failure Patient Education (See Patient Education module for education specifics.)  Outcome: Completed/Met Date Met:  07/07/12 Daughter says she has all the book and info from last admit. Pt has HH RN for CHF monitoring

## 2012-07-07 NOTE — Progress Notes (Signed)
Pt discharged to home per ambulance. Family has all personal belongings. Daughter has all  discharge instructions including follow up appt dates and med schedule.

## 2012-07-07 NOTE — Progress Notes (Signed)
Pt refused to let staff help him eat. Says his family comes in and feeds him. He is partially blind but can use his hands fine. Says his daughter comes between 79 and 8:30 am.   Marisa Cyphers RN

## 2012-07-07 NOTE — Progress Notes (Signed)
Went over all discharge instructions with daughter who is primary care giver.

## 2013-01-03 ENCOUNTER — Ambulatory Visit (INDEPENDENT_AMBULATORY_CARE_PROVIDER_SITE_OTHER): Payer: Medicare Other | Admitting: Pharmacist

## 2013-01-03 DIAGNOSIS — I4891 Unspecified atrial fibrillation: Secondary | ICD-10-CM

## 2013-01-03 DIAGNOSIS — I6350 Cerebral infarction due to unspecified occlusion or stenosis of unspecified cerebral artery: Secondary | ICD-10-CM | POA: Insufficient documentation

## 2013-01-03 LAB — POCT INR: INR: 3.7

## 2013-01-16 ENCOUNTER — Other Ambulatory Visit: Payer: Self-pay | Admitting: *Deleted

## 2013-01-16 ENCOUNTER — Other Ambulatory Visit: Payer: Self-pay | Admitting: Pharmacist

## 2013-01-16 MED ORDER — WARFARIN SODIUM 5 MG PO TABS: ORAL_TABLET | ORAL | Status: DC

## 2013-01-26 ENCOUNTER — Ambulatory Visit (INDEPENDENT_AMBULATORY_CARE_PROVIDER_SITE_OTHER): Payer: Medicare Other | Admitting: *Deleted

## 2013-01-26 DIAGNOSIS — I4891 Unspecified atrial fibrillation: Secondary | ICD-10-CM

## 2013-01-26 DIAGNOSIS — I6350 Cerebral infarction due to unspecified occlusion or stenosis of unspecified cerebral artery: Secondary | ICD-10-CM

## 2013-01-26 LAB — POCT INR: INR: 4.1

## 2013-02-08 ENCOUNTER — Ambulatory Visit (INDEPENDENT_AMBULATORY_CARE_PROVIDER_SITE_OTHER): Payer: Medicare Other | Admitting: Pharmacist

## 2013-02-08 DIAGNOSIS — I6350 Cerebral infarction due to unspecified occlusion or stenosis of unspecified cerebral artery: Secondary | ICD-10-CM

## 2013-02-08 DIAGNOSIS — I4891 Unspecified atrial fibrillation: Secondary | ICD-10-CM

## 2013-02-08 MED ORDER — WARFARIN SODIUM 2.5 MG PO TABS: ORAL_TABLET | ORAL | Status: DC

## 2013-02-28 ENCOUNTER — Encounter: Payer: Self-pay | Admitting: Interventional Cardiology

## 2013-03-01 ENCOUNTER — Other Ambulatory Visit: Payer: Self-pay | Admitting: *Deleted

## 2013-03-01 DIAGNOSIS — Z79899 Other long term (current) drug therapy: Secondary | ICD-10-CM

## 2013-03-14 ENCOUNTER — Other Ambulatory Visit: Payer: Medicare Other

## 2013-03-14 ENCOUNTER — Ambulatory Visit: Payer: Medicare Other | Admitting: Interventional Cardiology

## 2013-03-20 ENCOUNTER — Ambulatory Visit (INDEPENDENT_AMBULATORY_CARE_PROVIDER_SITE_OTHER): Payer: Medicare Other | Admitting: *Deleted

## 2013-03-20 ENCOUNTER — Ambulatory Visit (INDEPENDENT_AMBULATORY_CARE_PROVIDER_SITE_OTHER): Payer: Medicare Other | Admitting: Interventional Cardiology

## 2013-03-20 ENCOUNTER — Other Ambulatory Visit: Payer: Medicare Other

## 2013-03-20 ENCOUNTER — Encounter: Payer: Self-pay | Admitting: Interventional Cardiology

## 2013-03-20 VITALS — BP 138/70 | HR 45 | Ht 70.0 in | Wt 262.0 lb

## 2013-03-20 DIAGNOSIS — Z7901 Long term (current) use of anticoagulants: Secondary | ICD-10-CM

## 2013-03-20 DIAGNOSIS — I4891 Unspecified atrial fibrillation: Secondary | ICD-10-CM

## 2013-03-20 DIAGNOSIS — I5023 Acute on chronic systolic (congestive) heart failure: Secondary | ICD-10-CM

## 2013-03-20 DIAGNOSIS — Z79899 Other long term (current) drug therapy: Secondary | ICD-10-CM

## 2013-03-20 DIAGNOSIS — N183 Chronic kidney disease, stage 3 unspecified: Secondary | ICD-10-CM

## 2013-03-20 DIAGNOSIS — I509 Heart failure, unspecified: Secondary | ICD-10-CM

## 2013-03-20 DIAGNOSIS — I6350 Cerebral infarction due to unspecified occlusion or stenosis of unspecified cerebral artery: Secondary | ICD-10-CM

## 2013-03-20 DIAGNOSIS — I480 Paroxysmal atrial fibrillation: Secondary | ICD-10-CM

## 2013-03-20 LAB — HEPATIC FUNCTION PANEL
ALBUMIN: 3.3 g/dL — AB (ref 3.5–5.2)
ALT: 38 U/L (ref 0–53)
AST: 40 U/L — ABNORMAL HIGH (ref 0–37)
Alkaline Phosphatase: 93 U/L (ref 39–117)
BILIRUBIN TOTAL: 0.9 mg/dL (ref 0.3–1.2)
Bilirubin, Direct: 0.1 mg/dL (ref 0.0–0.3)
Total Protein: 7.6 g/dL (ref 6.0–8.3)

## 2013-03-20 LAB — BASIC METABOLIC PANEL
BUN: 25 mg/dL — ABNORMAL HIGH (ref 6–23)
CO2: 19 mEq/L (ref 19–32)
Calcium: 8.7 mg/dL (ref 8.4–10.5)
Chloride: 108 mEq/L (ref 96–112)
Creatinine, Ser: 2.1 mg/dL — ABNORMAL HIGH (ref 0.4–1.5)
GFR: 39.63 mL/min — AB (ref >60.00)
GLUCOSE: 135 mg/dL — AB (ref 70–99)
Potassium: 3.8 mEq/L (ref 3.5–5.1)
SODIUM: 140 meq/L (ref 135–145)

## 2013-03-20 LAB — POCT INR: INR: 1.6

## 2013-03-20 LAB — TSH: TSH: 2.59 u[IU]/mL (ref 0.35–5.50)

## 2013-03-20 NOTE — Progress Notes (Signed)
Patient ID: [name], male   DOB: [birth year], 79 y.o.   MRN: [id number] Past Medical History  AODM with renal disease   Hypertension   Hyperlipidemia   Afib, 1st degree heart block   Coronary artery disease. Status post CABG x6 1996   Ischemic cardiomyopathy, EF 10-15% 07/2011 - Dr Katrinka BlazingSmith   Embolic CVA, in [id number]   Gout-tophaceous-L elbow, erosions typical of gout hands and feet - Dr Nickola MajorHawkes   Osteoarthritis   H/O acute renal failure during diuresis with ACE on board, 2010   CKD3 - DM / HTN / weight   Morbid obesity - largely unable / unwilling to ambulate      [address] [place], [place]  [address] Phone: [phone] Fax:  [phone]  Date:  03/20/2013   ID:  [name], DOB [birth year], MRN [id number]  PCP:  Default, Provider, MD   ASSESSMENT:  1. Acute on chronic kidney injury resulting in reduction and diuretic regimen by Dr. Clelia CroftShaw 2. Chronic systolic heart failure, stable, with no evidence for fluid overload despite the reduction and diuretic dose.  3. Paroxysmal atrial fibrillation, in normal sinus rhythm with first-degree AV block 4. Amiodarone therapy without obvious toxicity  PLAN:B  1. Basic metabolic panel, TSH, and PA and lateral chest x-ray today 2. Maintain torsemide at 30 mg per day rather than 60 mg per day 3. Cautioned to call if increasing weight or dyspnea. 4. Clinical followup in 6 months   SUBJECTIVE: [name] is a 79 y.o. male is doing okay. He notifies me that his diuretic regimen has been changed by Dr. Clelia CroftShaw. He denies orthopnea. No PND. Appetite is been stable. His weight is increased from 241 and May 2 to 62 currently. He denies heart failure symptoms. Is no peripheral edema. No angina. No palpitations.   Wt Readings from Last 3 Encounters:  03/20/13 262 lb (118.842 kg)  07/07/12 241 lb 10 oz (109.6 kg)  08/25/11 271 lb 13.2 oz (123.3 kg)     Past Medical History  Diagnosis Date  . Gout   . Diabetes  mellitus   . Hypertension   . Hyperlipidemia   . Coronary artery disease   . Cerebral embolism   . Paroxysmal atrial fibrillation   . Chronic systolic heart failure   . Stroke     Current Outpatient Prescriptions  Medication Sig Dispense Refill  . amiodarone (PACERONE) 200 MG tablet Take 200 mg by mouth daily.      Marland Kitchen. aspirin EC 81 MG tablet Take 81 mg by mouth daily.      Marland Kitchen. atorvastatin (LIPITOR) 40 MG tablet Take 40 mg by mouth at bedtime.       . clonazePAM (KLONOPIN) 0.5 MG tablet Take 0.5 mg by mouth at bedtime as needed. For anxiety      . colchicine 0.6 MG tablet Take 0.5 tablets (0.3 mg total) by mouth daily.      . Febuxostat (ULORIC) 80 MG TABS Take 1 tablet by mouth daily.      . hydrALAZINE (APRESOLINE) 10 MG tablet Take 1 tablet (10 mg total) by mouth every 8 (eight) hours.  90 tablet  0  . isosorbide mononitrate (IMDUR) 60 MG 24 hr tablet Take 60 mg by mouth daily.      Marland Kitchen. latanoprost (XALATAN) 0.005 % ophthalmic solution Place 1 drop into both eyes at bedtime.      . metoprolol tartrate (LOPRESSOR) 25 MG tablet Take 25 mg  by mouth 2 (two) times daily.      . potassium chloride (K-DUR,KLOR-CON) 10 MEQ tablet Take 1 tablet (10 mEq total) by mouth daily.  30 tablet  11  . spironolactone (ALDACTONE) 25 MG tablet Take 1 tablet (25 mg total) by mouth daily.  30 tablet  11  . torsemide (DEMADEX) 20 MG tablet TAKE 1 AND 1/2 TABLET DAILY      . warfarin (COUMADIN) 2.5 MG tablet Take 1/2 tablet everyday of the week, or as directed by Coumadin Clinic.  60 tablet  1   No current facility-administered medications for this visit.    Allergies:    Allergies  Allergen Reactions  . Other     Ear drops  I used made my throat feel funny     Social History:  The patient  reports that he quit smoking about 45 years ago. His smoking use included Cigarettes. He has a 25 pack-year smoking history. He does not have any smokeless tobacco history on file.   ROS:  Please see the history of  present illness.   No complaints. Appetite has been good. Decreased vision both eyes to the point he is legally blind.   All other systems reviewed and negative.   OBJECTIVE: VS:  BP 138/70  Pulse 45  Ht 5\' 10"  (1.778 m)  Wt 262 lb (118.842 kg)  BMI 37.59 kg/m2 Well nourished, well developed, in no acute distress, appears somewhat dehydrated HEENT: normal Neck: JVD flat. Carotid bruit absent  Cardiac:  normal S1, S2; RRR; no murmur Lungs:  clear to auscultation bilaterally, no wheezing, rhonchi or rales Abd: soft, nontender, no hepatomegaly Ext: Edema trace bilateral. Pulses trace bilateral Skin: warm and dry Neuro:  CNs 2-12 intact, no focal abnormalities noted  EKG:  Anteroseptal infarct. Left bundle branch block. First degree AV block.       Signed, Darci Needle III, MD 03/20/2013 11:47 AM

## 2013-03-20 NOTE — Patient Instructions (Signed)
Your physician recommends that you continue on your current medications as directed. Please refer to the Current Medication list given to you today.  Labs Today: Bmet, Tsh  A chest x-ray takes a picture of the organs and structures inside the chest, including the heart, lungs, and blood vessels. This test can show several things, including, whether the heart is enlarges; whether fluid is building up in the lungs; and whether pacemaker / defibrillator leads are still in place.(To [facility] at [facility])  Your physician wants you to follow-up in: 6 months You will receive a reminder letter in the mail two months in advance. If you don't receive a letter, please call our office to schedule the follow-up appointment.

## 2013-03-27 ENCOUNTER — Telehealth: Payer: Self-pay

## 2013-03-27 NOTE — Telephone Encounter (Signed)
Message copied by Jarvis NewcomerPARRIS-GODLEY, LISA S on Tue Mar 27, 2013 10:24 AM ------      Message from: Verdis PrimeSMITH, HENRY      Created: Wed Mar 21, 2013  9:42 AM       Copy labs to Dr. Lupita RaiderKimberlee Shaw and let patient know to continue medication as listed. No evidence of dehydration. The is mild to moderate kidney impairment. ------

## 2013-03-27 NOTE — Telephone Encounter (Signed)
pt wife given lab results.Copy labs to Dr. Lupita RaiderKimberlee Shaw and let patient know to continue medication as listed. No evidence of dehydration. The is mild to moderate kidney impairment.pt wife verbalized understanding.

## 2013-03-28 ENCOUNTER — Other Ambulatory Visit: Payer: Self-pay

## 2013-03-28 MED ORDER — ISOSORBIDE MONONITRATE ER 60 MG PO TB24: 60.0000 mg | ORAL_TABLET | Freq: Every day | ORAL | Status: DC

## 2013-06-19 ENCOUNTER — Ambulatory Visit (INDEPENDENT_AMBULATORY_CARE_PROVIDER_SITE_OTHER): Payer: Medicare Other | Admitting: Pharmacist

## 2013-06-19 DIAGNOSIS — I6350 Cerebral infarction due to unspecified occlusion or stenosis of unspecified cerebral artery: Secondary | ICD-10-CM

## 2013-06-19 DIAGNOSIS — I4891 Unspecified atrial fibrillation: Secondary | ICD-10-CM

## 2013-06-19 LAB — POCT INR: INR: 2.3

## 2013-06-26 ENCOUNTER — Other Ambulatory Visit: Payer: Self-pay

## 2013-06-26 MED ORDER — POTASSIUM CHLORIDE CRYS ER 10 MEQ PO TBCR: 10.0000 meq | EXTENDED_RELEASE_TABLET | Freq: Every day | ORAL | Status: DC

## 2013-07-24 ENCOUNTER — Ambulatory Visit (INDEPENDENT_AMBULATORY_CARE_PROVIDER_SITE_OTHER): Payer: Medicare Other | Admitting: *Deleted

## 2013-07-24 DIAGNOSIS — I4891 Unspecified atrial fibrillation: Secondary | ICD-10-CM

## 2013-07-24 DIAGNOSIS — I6350 Cerebral infarction due to unspecified occlusion or stenosis of unspecified cerebral artery: Secondary | ICD-10-CM

## 2013-07-24 LAB — POCT INR: INR: 2.1

## 2013-08-01 ENCOUNTER — Other Ambulatory Visit: Payer: Self-pay

## 2013-08-01 MED ORDER — ISOSORBIDE MONONITRATE ER 60 MG PO TB24: 60.0000 mg | ORAL_TABLET | Freq: Every day | ORAL | Status: DC

## 2013-08-06 ENCOUNTER — Other Ambulatory Visit: Payer: Self-pay | Admitting: *Deleted

## 2013-08-06 MED ORDER — SPIRONOLACTONE 25 MG PO TABS: 25.0000 mg | ORAL_TABLET | Freq: Every day | ORAL | Status: DC

## 2013-09-05 ENCOUNTER — Telehealth: Payer: Self-pay | Admitting: *Deleted

## 2013-09-05 MED ORDER — WARFARIN SODIUM 2.5 MG PO TABS: ORAL_TABLET | ORAL | Status: DC

## 2013-09-05 NOTE — Telephone Encounter (Signed)
Patient would like coumadin refill sent to [facility]. Thanks, MI

## 2013-09-06 ENCOUNTER — Telehealth: Payer: Self-pay

## 2013-09-06 NOTE — Telephone Encounter (Signed)
One month of coumadin given and will complete refill on his visit

## 2013-09-07 MED ORDER — TORSEMIDE 20 MG PO TABS: 30.0000 mg | ORAL_TABLET | Freq: Once | ORAL | Status: DC

## 2013-09-07 NOTE — Telephone Encounter (Signed)
Done

## 2013-09-20 ENCOUNTER — Ambulatory Visit (INDEPENDENT_AMBULATORY_CARE_PROVIDER_SITE_OTHER): Payer: Medicare Other | Admitting: Interventional Cardiology

## 2013-09-20 ENCOUNTER — Ambulatory Visit (INDEPENDENT_AMBULATORY_CARE_PROVIDER_SITE_OTHER): Payer: Medicare Other | Admitting: *Deleted

## 2013-09-20 ENCOUNTER — Encounter: Payer: Self-pay | Admitting: Interventional Cardiology

## 2013-09-20 VITALS — BP 108/63 | HR 50 | Ht 70.0 in | Wt 260.0 lb

## 2013-09-20 DIAGNOSIS — I4891 Unspecified atrial fibrillation: Secondary | ICD-10-CM

## 2013-09-20 DIAGNOSIS — I5022 Chronic systolic (congestive) heart failure: Secondary | ICD-10-CM

## 2013-09-20 DIAGNOSIS — I6350 Cerebral infarction due to unspecified occlusion or stenosis of unspecified cerebral artery: Secondary | ICD-10-CM

## 2013-09-20 DIAGNOSIS — Z79899 Other long term (current) drug therapy: Secondary | ICD-10-CM

## 2013-09-20 DIAGNOSIS — I480 Paroxysmal atrial fibrillation: Secondary | ICD-10-CM

## 2013-09-20 DIAGNOSIS — Z7901 Long term (current) use of anticoagulants: Secondary | ICD-10-CM

## 2013-09-20 LAB — TSH: TSH: 0.93 u[IU]/mL (ref 0.35–4.50)

## 2013-09-20 LAB — POCT INR: INR: 2.2

## 2013-09-20 MED ORDER — WARFARIN SODIUM 2.5 MG PO TABS: ORAL_TABLET | ORAL | Status: DC

## 2013-09-20 NOTE — Progress Notes (Signed)
Patient ID: [name], male   DOB: [birth year], 79 y.o.   MRN: [id number]    [address] [place], [place]  [address] Phone: [phone] Fax:  [phone]  Date:  09/20/2013   ID:  [name], DOB [birth year], MRN [id number]  PCP:  Default, Provider, MD   ASSESSMENT:  1. Chronic systolic heart failure, currently diuretics on hold by Dr. Cam HaiKimberly Shaw. 2. Paroxysmal atrial fibrillation, with clinical evidence of sinus rhythm on amiodarone 3. Chronic amiodarone therapy 4. Chronic anticoagulation therapy 5. Coronary artery disease without angina 6. Legal blindness and difficulty ambulating making it difficult for the patient to attend office visits   PLAN:  1. Institute home health services to help with the care of this patient. Home health services will be for monitoring of congestive heart failure and blood draws for INR management 2. TSH will be obtained today. Laboratory data from Dr. Clelia CroftShaw done within the past several weeks were all reviewed. 3. Clinical followup in 6 months with TSH, hepatic panel, and he and lateral chest x-ray 4. He is cautioned to call if shortness of breath, swelling, chest pain, or other symptoms of heart decompensation   SUBJECTIVE: [name] is a 79 y.o. male was accompanied by his son. He seems quite down. He complains that he is legally blind now. He has macular degeneration, glaucoma, and diabetic retinopathy. He denies as dyspnea. He is wheelchair-bound. There is no peripheral edema. No palpitations or episodes of syncope. He has a very difficult time getting into the office because of all of his physical handicaps. He asked if we could help him arrange to have home health services.   Wt Readings from Last 3 Encounters:  09/20/13 260 lb (117.935 kg)  03/20/13 262 lb (118.842 kg)  07/07/12 241 lb 10 oz (109.6 kg)     Past Medical History  Diagnosis Date  . Gout   . Diabetes mellitus   . Hypertension   . Hyperlipidemia   .  Coronary artery disease   . Cerebral embolism   . Paroxysmal atrial fibrillation   . Chronic systolic heart failure   . Stroke     Current Outpatient Prescriptions  Medication Sig Dispense Refill  . amiodarone (PACERONE) 200 MG tablet Take 200 mg by mouth daily.      Marland Kitchen. aspirin EC 81 MG tablet Take 81 mg by mouth daily.      Marland Kitchen. atorvastatin (LIPITOR) 40 MG tablet Take 40 mg by mouth at bedtime.       . clonazePAM (KLONOPIN) 0.5 MG tablet Take 0.5 mg by mouth at bedtime as needed. For anxiety      . colchicine 0.6 MG tablet Take 0.5 tablets (0.3 mg total) by mouth daily.      . Febuxostat (ULORIC) 80 MG TABS Take 1 tablet by mouth daily.      . hydrALAZINE (APRESOLINE) 10 MG tablet Take 1 tablet (10 mg total) by mouth every 8 (eight) hours.  90 tablet  0  . isosorbide mononitrate (IMDUR) 60 MG 24 hr tablet Take 1 tablet (60 mg total) by mouth daily.  30 tablet  6  . latanoprost (XALATAN) 0.005 % ophthalmic solution Place 1 drop into both eyes at bedtime.      . metoprolol tartrate (LOPRESSOR) 25 MG tablet Take 25 mg by mouth 2 (two) times daily.      . potassium chloride (K-DUR,KLOR-CON) 10 MEQ tablet Take 1 tablet (10 mEq total) by mouth  daily.  30 tablet  6  . spironolactone (ALDACTONE) 25 MG tablet Take 1 tablet (25 mg total) by mouth daily.  30 tablet  1  . torsemide (DEMADEX) 20 MG tablet Take 30 mg by mouth once. TAKE 1 AND 1/2 TABLET DAILY  ON HOLD TEMPORARY AS OF LAST WEEK  09/10/13      . warfarin (COUMADIN) 2.5 MG tablet As directed by Coumadin Clinic.  30 tablet  0   No current facility-administered medications for this visit.    Allergies:    Allergies  Allergen Reactions  . Other     Ear drops  I used made my throat feel funny     Social History:  The patient  reports that he quit smoking about 45 years ago. His smoking use included Cigarettes. He has a 25 pack-year smoking history. He does not have any smokeless tobacco history on file.   ROS:  Please see the history of  present illness.   No known blood in the urine or stool. Appetite is stable. No nitroglycerin use.   All other systems reviewed and negative.   OBJECTIVE: VS:  BP 108/63  Pulse 50  Ht 5\' 10"  (1.778 m)  Wt 260 lb (117.935 kg)  BMI 37.31 kg/m2 Well nourished, well developed, in no acute distress, obese and somewhat pale-appearing  HEENT: normal Neck: JVD flat while sitting at 90. Carotid bruit absent  Cardiac:  normal S1, S2; RRR; no murmur Lungs:  clear to auscultation bilaterally, no wheezing, rhonchi or rales Abd: soft, nontender, no hepatomegaly Ext: Edema absent. Pulses 2+ bilateral  Skin: warm and dry Neuro:  CNs 2-12 intact, no focal abnormalities noted  EKG:  Not performed       Signed, Darci Needle III, MD 09/20/2013 10:44 AM

## 2013-09-20 NOTE — Patient Instructions (Signed)
Your physician recommends that you continue on your current medications as directed. Please refer to the Current Medication list given to you today.  Lab Today: Tsh  You have been referred to Home health  Your physician recommends that you return for lab work and a chest xray in 6 months  Your physician wants you to follow-up in: 6 months You will receive a reminder letter in the mail two months in advance. If you don't receive a letter, please call our office to schedule the follow-up appointment.

## 2013-09-21 ENCOUNTER — Telehealth: Payer: Self-pay

## 2013-09-21 NOTE — Telephone Encounter (Signed)
called [facility] to initiate ref for pt homehealth. spoke with Lurena JoinerRebecca she will fax over a face to face form to be completed and sent back to them.

## 2013-09-21 NOTE — Telephone Encounter (Signed)
Message copied by Jarvis NewcomerPARRIS-GODLEY, LISA S on Fri Sep 21, 2013  2:17 PM ------      Message from: Verdis PrimeSMITH, HENRY      Created: Fri Sep 21, 2013  1:12 PM       Normal ------

## 2013-09-21 NOTE — Telephone Encounter (Signed)
pt aware of lab results.tsh normal.

## 2013-09-24 ENCOUNTER — Other Ambulatory Visit: Payer: Self-pay | Admitting: Interventional Cardiology

## 2013-09-25 NOTE — Telephone Encounter (Signed)
returned pt wife call.she sts that pt has had a persistent cough only at night, pt sts that he has had wheezing as well only at night.pt sts cough is not productive. pt is not mobile and is unable to weigh daily.pt denies any visible LE edema, pt denies sob.pt has had persistent cough on and off for several months. Pt wife sts that she was adv by Dr.Smith if pt had an issue with coughing they are to call the office before going to the ED.Adv pt wife I will fwd Dr.Smith a message and call back with his recommendations. Pt wife verbalized understanding

## 2013-09-25 NOTE — Telephone Encounter (Signed)
New message     Patient wife calling C/O cough at night getting worse. Wheezing . PCP was not contacted.

## 2013-09-26 NOTE — Telephone Encounter (Signed)
Route to Dr.Smith for adv 

## 2013-09-27 ENCOUNTER — Other Ambulatory Visit: Payer: Self-pay

## 2013-09-27 DIAGNOSIS — I480 Paroxysmal atrial fibrillation: Secondary | ICD-10-CM

## 2013-09-27 DIAGNOSIS — Z7901 Long term (current) use of anticoagulants: Secondary | ICD-10-CM

## 2013-09-27 DIAGNOSIS — I5022 Chronic systolic (congestive) heart failure: Secondary | ICD-10-CM

## 2013-09-28 NOTE — Telephone Encounter (Signed)
The patient's primary care physician, Dr. Kirtland BouchardK. Clelia CroftShaw, has been holding torsemide. He should resume torsemide 60 mg per day and have a BMET performed Wednesday or Thursday of next week.

## 2013-10-02 MED ORDER — TORSEMIDE 20 MG PO TABS: 60.0000 mg | ORAL_TABLET | Freq: Every day | ORAL | Status: DC

## 2013-10-02 NOTE — Telephone Encounter (Signed)
pt wife given Dr.Smith instructions.retstart torsemide 60mg  daily.an rx has been sent to pt pharmacy.bmet in 1 wk. an ref [facility] submitted [facility] has not contacted pt as yet.adv pt wife I would f/u with [facility] to see when they are coming out to access pt, and if bmet can be drawn when the nurse comes out.adv pt wife if not pt will have to come to the office for a bmet.adv her I will call [facility] and call back to let her know.pt wife was thankful, agreeable with plan and verbalized understanding.

## 2013-10-04 NOTE — Telephone Encounter (Signed)
Called and spoke with Lill[facility]Lindsay @ [facility] she is faxing face to face encounter today.

## 2013-10-05 NOTE — Telephone Encounter (Signed)
Face to face encounter completed and signed by Dr.Smith. [facility] rqst given to have bmet drawn at pt home next wk 8/12 or 8/13

## 2013-10-11 ENCOUNTER — Encounter: Payer: Self-pay | Admitting: Interventional Cardiology

## 2013-10-17 ENCOUNTER — Other Ambulatory Visit: Payer: Self-pay | Admitting: *Deleted

## 2013-10-17 MED ORDER — AMIODARONE HCL 200 MG PO TABS: 200.0000 mg | ORAL_TABLET | Freq: Every day | ORAL | Status: DC

## 2013-10-18 ENCOUNTER — Other Ambulatory Visit: Payer: Self-pay

## 2013-10-18 ENCOUNTER — Ambulatory Visit (INDEPENDENT_AMBULATORY_CARE_PROVIDER_SITE_OTHER): Payer: Medicare Other | Admitting: Internal Medicine

## 2013-10-18 DIAGNOSIS — I6350 Cerebral infarction due to unspecified occlusion or stenosis of unspecified cerebral artery: Secondary | ICD-10-CM

## 2013-10-18 LAB — POCT INR: INR: 2

## 2013-10-18 MED ORDER — TORSEMIDE 20 MG PO TABS: 40.0000 mg | ORAL_TABLET | Freq: Every day | ORAL | Status: DC

## 2013-10-23 ENCOUNTER — Telehealth: Payer: Self-pay

## 2013-10-23 NOTE — Telephone Encounter (Signed)
Message copied by Jarvis Newcomer on Tue Oct 23, 2013  1:55 PM ------      Message from: Verdis Prime      Created: Sun Oct 21, 2013  2:35 PM      Regarding: RE: ASK Dr Katrinka Blazing       Repeat BMET this week      ----- Message -----         From: Sharyn Blitz, RN         Sent: 10/18/2013  10:59 AM           To: Lesleigh Noe, MD, #      Subject: ASK Dr Katrinka Blazing                                             8/13 Lab scanned into the system--Torsemide was decreased to  daily on 8/14 due to Creatining 2.86.  Please ask Dr Katrinka Blazing when he would like home health to recheck BMP on this pt.             Thank you,      Lauren        ------

## 2013-10-23 NOTE — Telephone Encounter (Signed)
[facility] aware and have verbal order to draw bmet and fax results to [facility] attn: Dr.Smith. nurse is sch to go out to pt home on 8/26

## 2013-10-24 ENCOUNTER — Encounter: Payer: Self-pay | Admitting: Interventional Cardiology

## 2013-11-01 ENCOUNTER — Ambulatory Visit (INDEPENDENT_AMBULATORY_CARE_PROVIDER_SITE_OTHER): Payer: Medicare Other | Admitting: Cardiology

## 2013-11-01 DIAGNOSIS — I6350 Cerebral infarction due to unspecified occlusion or stenosis of unspecified cerebral artery: Secondary | ICD-10-CM

## 2013-11-01 LAB — POCT INR: INR: 2.3

## 2013-11-21 ENCOUNTER — Ambulatory Visit (INDEPENDENT_AMBULATORY_CARE_PROVIDER_SITE_OTHER): Payer: Medicare Other | Admitting: Interventional Cardiology

## 2013-11-21 DIAGNOSIS — I6350 Cerebral infarction due to unspecified occlusion or stenosis of unspecified cerebral artery: Secondary | ICD-10-CM

## 2013-11-21 LAB — POCT INR: INR: 2.3

## 2013-12-12 ENCOUNTER — Telehealth: Payer: Self-pay | Admitting: Interventional Cardiology

## 2013-12-12 NOTE — Telephone Encounter (Signed)
New message          Pt wife would like to know if you could arrange for a nurse to come out and check his coumadin while his wound heals up on his behind

## 2013-12-15 ENCOUNTER — Other Ambulatory Visit: Payer: Self-pay | Admitting: Interventional Cardiology

## 2013-12-17 NOTE — Telephone Encounter (Signed)
Follow up           Pt wife calling back to see if you can send a nurse out to check his coumadin / pt cannot ride in car or on [employer]

## 2013-12-17 NOTE — Telephone Encounter (Signed)
Spoke with Administrator, Civil Service @ [facility] to see why pt was d/c from health.pt goal were met. Returned pt wife call. Spoke with pt son adv him that pt was d/c from [facility] because his goals were met. Pt is stable from a cardiac standpoint. Pt son st that pt is having mobility issues and is not able to travel for regular his regular coumadin visits. Adv pt son that medicare will not cover home visits just to have INR checked.adv pt son I will talk with the coumadin clinic to see if they have any other resources.pt son adv to talk with pcp for possible ref to [facility] for non cardiac issues.pt son verbalized understanding

## 2014-01-08 ENCOUNTER — Telehealth: Payer: Self-pay | Admitting: Interventional Cardiology

## 2014-01-08 MED ORDER — TORSEMIDE 20 MG PO TABS: 40.0000 mg | ORAL_TABLET | Freq: Every day | ORAL | Status: DC

## 2014-01-08 NOTE — Telephone Encounter (Signed)
New problem   Pt's daughter need new prescription for pt's Torsemide 20mg . And need to know how pt need to take it. Please advise.

## 2014-01-08 NOTE — Telephone Encounter (Signed)
Pt daughter aware that pt should be taking 40mg  of torsemide daily, an Rx has been sent to pt pharmacy. Pt has not been coming to his [facility] appt to have his INR checked.pt daughter sts that pt is not able public transportation Because if his mobility issues, and has no other way to get to his appt. Adv her that [facility] dismissed pt because he is stable from a cardiac standpoint, adv her that medicare will not cover Home health just for INR cks.adv her that [facility] whos sts that pt is stable at this time and that pt does not qualify for home health.at this time. Adv pt daughter I will fwd Dr.Smith an update about pt Coumadin and call back with his recommendation. She verbalized understanding.

## 2014-01-14 NOTE — Telephone Encounter (Signed)
The patient needs home health for CHF management and to assist with blood draws for INR management. Obesity and immobility prevent the patient from having office visits.

## 2014-01-15 ENCOUNTER — Emergency Department (HOSPITAL_COMMUNITY)
Admission: EM | Admit: 2014-01-15 | Discharge: 2014-01-15 | Disposition: A | Discharge status: Home / Self Care | Payer: Medicare Other | Source: Home / Self Care | Attending: Emergency Medicine | Admitting: Emergency Medicine

## 2014-01-15 DIAGNOSIS — R944 Abnormal results of kidney function studies: Secondary | ICD-10-CM

## 2014-01-15 DIAGNOSIS — R21 Rash and other nonspecific skin eruption: Secondary | ICD-10-CM

## 2014-01-15 DIAGNOSIS — Z951 Presence of aortocoronary bypass graft: Secondary | ICD-10-CM

## 2014-01-15 DIAGNOSIS — N179 Acute kidney failure, unspecified: Secondary | ICD-10-CM | POA: Diagnosis not present

## 2014-01-15 DIAGNOSIS — I10 Essential (primary) hypertension: Secondary | ICD-10-CM | POA: Insufficient documentation

## 2014-01-15 DIAGNOSIS — Z79899 Other long term (current) drug therapy: Secondary | ICD-10-CM

## 2014-01-15 DIAGNOSIS — I5022 Chronic systolic (congestive) heart failure: Secondary | ICD-10-CM | POA: Insufficient documentation

## 2014-01-15 DIAGNOSIS — Z8739 Personal history of other diseases of the musculoskeletal system and connective tissue: Secondary | ICD-10-CM

## 2014-01-15 DIAGNOSIS — I2510 Atherosclerotic heart disease of native coronary artery without angina pectoris: Secondary | ICD-10-CM | POA: Insufficient documentation

## 2014-01-15 DIAGNOSIS — E785 Hyperlipidemia, unspecified: Secondary | ICD-10-CM | POA: Insufficient documentation

## 2014-01-15 DIAGNOSIS — Z7982 Long term (current) use of aspirin: Secondary | ICD-10-CM | POA: Insufficient documentation

## 2014-01-15 DIAGNOSIS — E119 Type 2 diabetes mellitus without complications: Secondary | ICD-10-CM | POA: Insufficient documentation

## 2014-01-15 DIAGNOSIS — Z8673 Personal history of transient ischemic attack (TIA), and cerebral infarction without residual deficits: Secondary | ICD-10-CM | POA: Insufficient documentation

## 2014-01-15 DIAGNOSIS — R001 Bradycardia, unspecified: Secondary | ICD-10-CM

## 2014-01-15 DIAGNOSIS — R531 Weakness: Secondary | ICD-10-CM | POA: Insufficient documentation

## 2014-01-15 DIAGNOSIS — Z7901 Long term (current) use of anticoagulants: Secondary | ICD-10-CM | POA: Insufficient documentation

## 2014-01-15 DIAGNOSIS — Z87891 Personal history of nicotine dependence: Secondary | ICD-10-CM | POA: Insufficient documentation

## 2014-01-15 LAB — CBC WITH DIFFERENTIAL/PLATELET
BASOS PCT: 0 % (ref 0–1)
Basophils Absolute: 0 10*3/uL (ref 0.0–0.1)
EOS ABS: 0.2 10*3/uL (ref 0.0–0.7)
EOS PCT: 2 % (ref 0–5)
HCT: 50.1 % (ref 39.0–52.0)
HEMOGLOBIN: 16.7 g/dL (ref 13.0–17.0)
Lymphocytes Relative: 14 % (ref 12–46)
Lymphs Abs: 1.2 10*3/uL (ref 0.7–4.0)
MCH: 31.5 pg (ref 26.0–34.0)
MCHC: 33.3 g/dL (ref 30.0–36.0)
MCV: 94.4 fL (ref 78.0–100.0)
MONOS PCT: 7 % (ref 3–12)
Monocytes Absolute: 0.6 10*3/uL (ref 0.1–1.0)
NEUTROS PCT: 77 % (ref 43–77)
Neutro Abs: 6.9 10*3/uL (ref 1.7–7.7)
Platelets: 142 10*3/uL — ABNORMAL LOW (ref 150–400)
RBC: 5.31 MIL/uL (ref 4.22–5.81)
RDW: 15.6 % — ABNORMAL HIGH (ref 11.5–15.5)
WBC: 8.9 10*3/uL (ref 4.0–10.5)

## 2014-01-15 LAB — TSH: TSH: 4.53 u[IU]/mL — ABNORMAL HIGH (ref 0.350–4.500)

## 2014-01-15 LAB — URINALYSIS, ROUTINE W REFLEX MICROSCOPIC
BILIRUBIN URINE: NEGATIVE
Glucose, UA: NEGATIVE mg/dL
Hgb urine dipstick: NEGATIVE
Ketones, ur: NEGATIVE mg/dL
LEUKOCYTES UA: NEGATIVE
NITRITE: NEGATIVE
PROTEIN: NEGATIVE mg/dL
Specific Gravity, Urine: 1.013 (ref 1.005–1.030)
Urobilinogen, UA: 1 mg/dL (ref 0.0–1.0)
pH: 5.5 (ref 5.0–8.0)

## 2014-01-15 LAB — BASIC METABOLIC PANEL
Anion gap: 16 — ABNORMAL HIGH (ref 5–15)
BUN: 79 mg/dL — AB (ref 6–23)
CALCIUM: 9.7 mg/dL (ref 8.4–10.5)
CO2: 19 mEq/L (ref 19–32)
CREATININE: 3.14 mg/dL — AB (ref 0.50–1.35)
Chloride: 98 mEq/L (ref 96–112)
GFR, EST AFRICAN AMERICAN: 20 mL/min — AB (ref >90)
GFR, EST NON AFRICAN AMERICAN: 18 mL/min — AB (ref >90)
Glucose, Bld: 147 mg/dL — ABNORMAL HIGH (ref 70–99)
POTASSIUM: 5.3 meq/L (ref 3.7–5.3)
Sodium: 133 mEq/L — ABNORMAL LOW (ref 137–147)

## 2014-01-15 LAB — PROTIME-INR
INR: 2.35 — ABNORMAL HIGH (ref 0.00–1.49)
Prothrombin Time: 25.9 seconds — ABNORMAL HIGH (ref 11.6–15.2)

## 2014-01-15 MED ORDER — METOPROLOL TARTRATE 12.5 MG HALF TABLET: 25.0000 mg | ORAL_TABLET | Freq: Two times a day (BID) | ORAL | Status: AC

## 2014-01-15 MED ORDER — OXYCODONE-ACETAMINOPHEN 5-325 MG PO TABS
1.0000 | ORAL_TABLET | Freq: Once | ORAL | Status: AC
  Administered 2014-01-15: 1 via ORAL
  Filled 2014-01-15: qty 1

## 2014-01-15 MED ORDER — CLOTRIMAZOLE-BETAMETHASONE 1-0.05 % EX CREA: TOPICAL_CREAM | CUTANEOUS | Status: AC

## 2014-01-15 NOTE — Discharge Instructions (Signed)
Call for a follow up appointment with a Family or Primary Care Provider for repeat of your kidney function tests. Call your cardiologist for further evaluation of your slow heart rate. Return if Symptoms worsen.   Take medication as prescribed.  Keep your rash clean and dry.   Use the cream prescribed today, do not use more hydrocortisone cream, this medication has similar (stronger medication) already in it.

## 2014-01-15 NOTE — ED Notes (Signed)
[facility] paged for pt transport home 

## 2014-01-15 NOTE — ED Notes (Addendum)
Increased weakness and confusion. Leg pain. Confused about day and refused to answer year; vs 110/62, 50, 18, sao2 90%, cbg 163. Rash developing in folds of skin by groin as well as on the back. Son putting barrier cream on it. Pt. From home.

## 2014-01-15 NOTE — ED Provider Notes (Signed)
CSN: [id number]     Arrival date & time 01/15/14  [id number] History   First MD Initiated Contact with Patient 01/15/14 [phone]     Chief Complaint  Patient presents with  . Weakness  . Altered Mental Status     (Consider location/radiation/quality/duration/timing/severity/associated sxs/prior Treatment) HPI Comments: The patient is a 79 year old male with past mental history of diabetes, hypertension, CAD, paroxysmal A. Fib, heart failure, CVA presenting to emergency room chief complaint of rash. Patient's family reports onset of rash to 3 weeks ago was seen by PCP for similar complaints. Patient's family concerned rash is spreading up left flank, worsening over the past two days.  Family also reports urinary incontinence in sleep over the past several days. Patient's daughter reports hydrocortisone cream and zinc oxide on rash. Patient describes rashes painful. Patient's family denies fever, chills, cough or change in baseline mentation. Denies weakness or change in baseline mentation. Referring Physician: Cam HaiKimberly Hill, M.D. Primary Cardiologist:: Alanda AmassHenry WB Leia Hill, Hill, M.D.  The history is provided by the patient and a relative. No language interpreter was used.    Past Medical History  Diagnosis Date  . Gout   . Diabetes mellitus   . Hypertension   . Hyperlipidemia   . Coronary artery disease   . Cerebral embolism   . Paroxysmal atrial fibrillation   . Chronic systolic heart failure   . Stroke    Past Surgical History  Procedure Laterality Date  . Coronary artery bypass graft    . Cholecystectomy     Family History  Problem Relation Age of Onset  . Coronary artery disease Maternal Aunt    History  Substance Use Topics  . Smoking status: Former Smoker -- 1.00 packs/day for 25 years    Types: Cigarettes    Quit date: 03/01/1968  . Smokeless tobacco: Not on file  . Alcohol Use: Not on file    Review of Systems  Constitutional: Negative for fever and chills.  Respiratory:  Negative for chest tightness, shortness of breath and wheezing.   Cardiovascular: Negative for chest pain.  Gastrointestinal: Negative for abdominal pain and diarrhea.  Skin: Positive for rash.      Allergies  Other  Home Medications   Prior to Admission medications   Medication Sig Start Date End Date Taking? Authorizing Provider  amiodarone (PACERONE) 200 MG tablet Take 1 tablet (200 mg total) by mouth daily. 10/17/13   Dalton RecordsHenry W Smith III, MD  aspirin EC 81 MG tablet Take 81 mg by mouth daily.    Historical Provider, MD  atorvastatin (LIPITOR) 40 MG tablet Take 40 mg by mouth at bedtime.     Historical Provider, MD  clonazePAM (KLONOPIN) 0.5 MG tablet Take 0.5 mg by mouth at bedtime as needed. For anxiety    Historical Provider, MD  colchicine 0.6 MG tablet Take 0.5 tablets (0.3 mg total) by mouth daily. 08/25/11   Dalton EtienneAnand D Hongalgi, MD  Febuxostat (ULORIC) 80 MG TABS Take 1 tablet by mouth daily.    Historical Provider, MD  hydrALAZINE (APRESOLINE) 10 MG tablet Take 1 tablet (10 mg total) by mouth every 8 (eight) hours. 08/25/11 09/20/13  Dalton EtienneAnand D Hongalgi, MD  isosorbide mononitrate (IMDUR) 60 MG 24 hr tablet Take 1 tablet (60 mg total) by mouth daily. 08/01/13   Dalton RecordsHenry W Smith III, MD  latanoprost (XALATAN) 0.005 % ophthalmic solution Place 1 drop into both eyes at bedtime.    Historical Provider, MD  metoprolol tartrate (LOPRESSOR) 25 MG tablet Take 25  mg by mouth 2 (two) times daily.    Historical Provider, MD  potassium chloride (K-DUR,KLOR-CON) 10 MEQ tablet Take 1 tablet (10 mEq total) by mouth daily. 06/26/13   Dalton Records III, MD  spironolactone (ALDACTONE) 25 MG tablet TAKE 1 TABLET (25 MG TOTAL) BY MOUTH DAILY. 09/24/13   Dalton Records III, MD  torsemide (DEMADEX) 20 MG tablet Take 2 tablets (40 mg total) by mouth daily. 01/08/14   Dalton Records III, MD  warfarin (COUMADIN) 2.5 MG tablet AS DIRECTED BY COUMADIN CLINIC. 12/17/13   Dalton Records III, MD   There were no vitals taken  for this visit. Physical Exam  Constitutional: He appears well-developed and well-nourished.  HENT:  Head: Normocephalic and atraumatic.  Eyes: EOM are normal.  Neck: Neck supple.  Cardiovascular: An irregularly irregular rhythm present. Bradycardia present.   No lower extremity edema  Pulmonary/Chest: No accessory muscle usage. No respiratory distress. He has no decreased breath sounds. He has no wheezes.  Abdominal: Soft. There is no tenderness. There is no rigidity and no guarding.  Neurological: He is alert. He is disoriented. No cranial nerve deficit or sensory deficit. GCS eye subscore is 4. GCS verbal subscore is 5. GCS motor subscore is 6.  Disoriented to time. Speech is clear and goal oriented, follows commands II-Visual fields were normal.   Hill/IV/VI-Pupils were equal and reacted. Extraocular movements were full and conjugate.  V/VII-no facial droop.   VIII-normal.   Motor: Strength 5/5 to upper and lower extremities bilaterally. Moves all 4 extremities equally. Sensory: normal sensation to upper and lower extremities.  Cerebellar: Finger-nose finger- not tested due to pt history of blindness. Left upper extremity pronator drift.  Gait untested.  Skin: Skin is warm. Rash noted. He is not diaphoretic.  Eurhythmic rash to buttock, left flank, with associated satellite lesions. White ointment overlying rash.  Psychiatric: He has a normal mood and affect. His behavior is normal.  Nursing note and vitals reviewed.   ED Course  Procedures (including critical care time) Labs Review Labs Reviewed  CBC WITH DIFFERENTIAL - Abnormal; Notable for the following:    RDW 15.6 (*)    Platelets 142 (*)    All other components within normal limits  BASIC METABOLIC PANEL - Abnormal; Notable for the following:    Sodium 133 (*)    Glucose, Bld 147 (*)    BUN 79 (*)    Creatinine, Ser 3.14 (*)    GFR calc non Af Amer 18 (*)    GFR calc Af Amer 20 (*)    Anion gap 16 (*)    All  other components within normal limits  PROTIME-INR - Abnormal; Notable for the following:    Prothrombin Time 25.9 (*)    INR 2.35 (*)    All other components within normal limits  TSH - Abnormal; Notable for the following:    TSH 4.530 (*)    All other components within normal limits  URINALYSIS, ROUTINE W REFLEX MICROSCOPIC    Imaging Review No results found.   EKG Interpretation   Date/Time:  Tuesday January 15 2014 09:45:10 EST Ventricular Rate:  47 PR Interval:    QRS Duration: 128 QT Interval:  528 QTC Calculation: 467 R Axis:   18 Text Interpretation:  Age not entered, assumed to be  79 years old for  purpose of ECG interpretation Atrial fibrillation Left bundle branch block  Confirmed by ZAMMIT  MD, [name] [phone]) on 01/15/2014 12:14:59 PM  MDM   Final diagnoses:  Weakness  Bradycardia  Rash  Abnormal renal function test   Initially patient story worrisome for increased weakness and confusion, however when family members arrived concern of worsening rash on the left flank and buttocks. Consistent with candidiasis, likely secondary to increase in moisture and patient laying in bed all day. Dr. Estell HarpinZammit also evaluated the patient during this encounter 1230 Discussed pt history and condition with Dr. Katrinka BlazingSmith, pt's cardiologist. Advises TSH and metoprolol 12.5 BID. Call to lab for TSH results.  TSH, slightly elevated. Plan to follow up with PCP. 3:54 PM Discussed lab results, imaging results, and treatment plan with the patient. Return precautions given. Reports understanding and no other concerns at this time.  Patient is stable for discharge at this time.  Mellody DrownLauren Jhace Fennell, PA-C 01/16/14 1312  Benny LennertJoseph L Zammit, MD 01/18/14 [phone]

## 2014-01-17 ENCOUNTER — Inpatient Hospital Stay (HOSPITAL_COMMUNITY)
Admission: EM | Admit: 2014-01-17 | Discharge: 2014-01-20 | DRG: 683 | Disposition: A | Discharge status: Home / Self Care | Payer: Medicare Other | Attending: Internal Medicine | Admitting: Internal Medicine

## 2014-01-17 ENCOUNTER — Encounter (HOSPITAL_COMMUNITY): Payer: Self-pay | Admitting: Emergency Medicine

## 2014-01-17 ENCOUNTER — Inpatient Hospital Stay (HOSPITAL_COMMUNITY): Payer: Medicare Other

## 2014-01-17 DIAGNOSIS — I480 Paroxysmal atrial fibrillation: Secondary | ICD-10-CM | POA: Diagnosis present

## 2014-01-17 DIAGNOSIS — I5042 Chronic combined systolic (congestive) and diastolic (congestive) heart failure: Secondary | ICD-10-CM | POA: Diagnosis present

## 2014-01-17 DIAGNOSIS — Z7982 Long term (current) use of aspirin: Secondary | ICD-10-CM | POA: Diagnosis not present

## 2014-01-17 DIAGNOSIS — Z79899 Other long term (current) drug therapy: Secondary | ICD-10-CM | POA: Diagnosis not present

## 2014-01-17 DIAGNOSIS — D696 Thrombocytopenia, unspecified: Secondary | ICD-10-CM | POA: Diagnosis present

## 2014-01-17 DIAGNOSIS — M109 Gout, unspecified: Secondary | ICD-10-CM | POA: Diagnosis present

## 2014-01-17 DIAGNOSIS — R21 Rash and other nonspecific skin eruption: Secondary | ICD-10-CM | POA: Diagnosis present

## 2014-01-17 DIAGNOSIS — Z951 Presence of aortocoronary bypass graft: Secondary | ICD-10-CM

## 2014-01-17 DIAGNOSIS — N281 Cyst of kidney, acquired: Secondary | ICD-10-CM | POA: Diagnosis present

## 2014-01-17 DIAGNOSIS — E119 Type 2 diabetes mellitus without complications: Secondary | ICD-10-CM | POA: Diagnosis present

## 2014-01-17 DIAGNOSIS — Z7401 Bed confinement status: Secondary | ICD-10-CM

## 2014-01-17 DIAGNOSIS — E875 Hyperkalemia: Secondary | ICD-10-CM | POA: Diagnosis present

## 2014-01-17 DIAGNOSIS — I2510 Atherosclerotic heart disease of native coronary artery without angina pectoris: Secondary | ICD-10-CM | POA: Diagnosis present

## 2014-01-17 DIAGNOSIS — N183 Chronic kidney disease, stage 3 unspecified: Secondary | ICD-10-CM | POA: Diagnosis present

## 2014-01-17 DIAGNOSIS — I129 Hypertensive chronic kidney disease with stage 1 through stage 4 chronic kidney disease, or unspecified chronic kidney disease: Secondary | ICD-10-CM | POA: Diagnosis present

## 2014-01-17 DIAGNOSIS — I669 Occlusion and stenosis of unspecified cerebral artery: Secondary | ICD-10-CM

## 2014-01-17 DIAGNOSIS — L89152 Pressure ulcer of sacral region, stage 2: Secondary | ICD-10-CM | POA: Diagnosis present

## 2014-01-17 DIAGNOSIS — Z7901 Long term (current) use of anticoagulants: Secondary | ICD-10-CM

## 2014-01-17 DIAGNOSIS — E785 Hyperlipidemia, unspecified: Secondary | ICD-10-CM | POA: Diagnosis present

## 2014-01-17 DIAGNOSIS — Z66 Do not resuscitate: Secondary | ICD-10-CM | POA: Diagnosis present

## 2014-01-17 DIAGNOSIS — R5381 Other malaise: Secondary | ICD-10-CM | POA: Diagnosis present

## 2014-01-17 DIAGNOSIS — N179 Acute kidney failure, unspecified: Secondary | ICD-10-CM | POA: Diagnosis present

## 2014-01-17 DIAGNOSIS — N390 Urinary tract infection, site not specified: Secondary | ICD-10-CM

## 2014-01-17 DIAGNOSIS — Z888 Allergy status to other drugs, medicaments and biological substances status: Secondary | ICD-10-CM

## 2014-01-17 DIAGNOSIS — B964 Proteus (mirabilis) (morganii) as the cause of diseases classified elsewhere: Secondary | ICD-10-CM | POA: Diagnosis present

## 2014-01-17 DIAGNOSIS — Z8673 Personal history of transient ischemic attack (TIA), and cerebral infarction without residual deficits: Secondary | ICD-10-CM | POA: Diagnosis not present

## 2014-01-17 DIAGNOSIS — I5022 Chronic systolic (congestive) heart failure: Secondary | ICD-10-CM | POA: Diagnosis present

## 2014-01-17 LAB — URINE MICROSCOPIC-ADD ON

## 2014-01-17 LAB — COMPREHENSIVE METABOLIC PANEL
ALT: 64 U/L — ABNORMAL HIGH (ref 0–53)
AST: 53 U/L — ABNORMAL HIGH (ref 0–37)
Albumin: 3.2 g/dL — ABNORMAL LOW (ref 3.5–5.2)
Alkaline Phosphatase: 108 U/L (ref 39–117)
Anion gap: 16 — ABNORMAL HIGH (ref 5–15)
BUN: 75 mg/dL — ABNORMAL HIGH (ref 6–23)
CO2: 20 meq/L (ref 19–32)
CREATININE: 3.04 mg/dL — AB (ref 0.50–1.35)
Calcium: 9.8 mg/dL (ref 8.4–10.5)
Chloride: 96 mEq/L (ref 96–112)
GFR calc Af Amer: 21 mL/min — ABNORMAL LOW (ref >90)
GFR, EST NON AFRICAN AMERICAN: 18 mL/min — AB (ref >90)
Glucose, Bld: 159 mg/dL — ABNORMAL HIGH (ref 70–99)
Potassium: 5.2 mEq/L (ref 3.7–5.3)
Sodium: 132 mEq/L — ABNORMAL LOW (ref 137–147)
Total Bilirubin: 0.9 mg/dL (ref 0.3–1.2)
Total Protein: 8.1 g/dL (ref 6.0–8.3)

## 2014-01-17 LAB — URINALYSIS, ROUTINE W REFLEX MICROSCOPIC
Bilirubin Urine: NEGATIVE
GLUCOSE, UA: NEGATIVE mg/dL
KETONES UR: NEGATIVE mg/dL
Nitrite: NEGATIVE
Protein, ur: NEGATIVE mg/dL
Specific Gravity, Urine: 1.01 (ref 1.005–1.030)
Urobilinogen, UA: 1 mg/dL (ref 0.0–1.0)
pH: 7 (ref 5.0–8.0)

## 2014-01-17 LAB — CBC WITH DIFFERENTIAL/PLATELET
BASOS ABS: 0 10*3/uL (ref 0.0–0.1)
Basophils Relative: 0 % (ref 0–1)
EOS PCT: 1 % (ref 0–5)
Eosinophils Absolute: 0.1 10*3/uL (ref 0.0–0.7)
HCT: 48.4 % (ref 39.0–52.0)
HEMOGLOBIN: 16.4 g/dL (ref 13.0–17.0)
Lymphocytes Relative: 9 % — ABNORMAL LOW (ref 12–46)
Lymphs Abs: 1.1 10*3/uL (ref 0.7–4.0)
MCH: 32.3 pg (ref 26.0–34.0)
MCHC: 33.9 g/dL (ref 30.0–36.0)
MCV: 95.5 fL (ref 78.0–100.0)
MONO ABS: 0.9 10*3/uL (ref 0.1–1.0)
Monocytes Relative: 7 % (ref 3–12)
Neutro Abs: 11 10*3/uL — ABNORMAL HIGH (ref 1.7–7.7)
Neutrophils Relative %: 83 % — ABNORMAL HIGH (ref 43–77)
Platelets: 146 10*3/uL — ABNORMAL LOW (ref 150–400)
RBC: 5.07 MIL/uL (ref 4.22–5.81)
RDW: 15.7 % — AB (ref 11.5–15.5)
WBC: 13.1 10*3/uL — AB (ref 4.0–10.5)

## 2014-01-17 LAB — GLUCOSE, CAPILLARY: GLUCOSE-CAPILLARY: 172 mg/dL — AB (ref 70–99)

## 2014-01-17 LAB — PROTIME-INR
INR: 2.69 — AB (ref 0.00–1.49)
Prothrombin Time: 28.8 seconds — ABNORMAL HIGH (ref 11.6–15.2)

## 2014-01-17 MED ORDER — INSULIN ASPART 100 UNIT/ML ~~LOC~~ SOLN
0.0000 [IU] | Freq: Three times a day (TID) | SUBCUTANEOUS | Status: DC
  Administered 2014-01-18: 2 [IU] via SUBCUTANEOUS
  Administered 2014-01-18 – 2014-01-19 (×3): 1 [IU] via SUBCUTANEOUS
  Administered 2014-01-19: 2 [IU] via SUBCUTANEOUS
  Administered 2014-01-19 – 2014-01-20 (×2): 1 [IU] via SUBCUTANEOUS

## 2014-01-17 MED ORDER — METOPROLOL TARTRATE 25 MG PO TABS
25.0000 mg | ORAL_TABLET | Freq: Two times a day (BID) | ORAL | Status: DC
  Filled 2014-01-17: qty 1

## 2014-01-17 MED ORDER — WARFARIN 1.25 MG HALF TABLET
1.2500 mg | ORAL_TABLET | Freq: Once | ORAL | Status: AC
  Administered 2014-01-17: 1.25 mg via ORAL
  Filled 2014-01-17: qty 1

## 2014-01-17 MED ORDER — CEFTRIAXONE SODIUM IN DEXTROSE 20 MG/ML IV SOLN
1.0000 g | INTRAVENOUS | Status: DC
  Administered 2014-01-17 – 2014-01-19 (×3): 1 g via INTRAVENOUS
  Filled 2014-01-17 (×4): qty 50

## 2014-01-17 MED ORDER — INSULIN ASPART 100 UNIT/ML ~~LOC~~ SOLN: 0.0000 [IU] | Freq: Every day | SUBCUTANEOUS | Status: DC

## 2014-01-17 MED ORDER — COLCHICINE 0.6 MG PO TABS
0.3000 mg | ORAL_TABLET | Freq: Every day | ORAL | Status: DC
  Administered 2014-01-18 – 2014-01-20 (×3): 0.3 mg via ORAL
  Filled 2014-01-17 (×3): qty 0.5

## 2014-01-17 MED ORDER — CLONAZEPAM 0.5 MG PO TABS
0.5000 mg | ORAL_TABLET | Freq: Every evening | ORAL | Status: DC | PRN
  Administered 2014-01-17: 0.5 mg via ORAL
  Filled 2014-01-17: qty 1

## 2014-01-17 MED ORDER — ASPIRIN EC 81 MG PO TBEC
81.0000 mg | DELAYED_RELEASE_TABLET | Freq: Every day | ORAL | Status: DC
  Administered 2014-01-18 – 2014-01-20 (×3): 81 mg via ORAL
  Filled 2014-01-17 (×3): qty 1

## 2014-01-17 MED ORDER — ONDANSETRON HCL 4 MG/2ML IJ SOLN: 4.0000 mg | Freq: Four times a day (QID) | INTRAMUSCULAR | Status: DC | PRN

## 2014-01-17 MED ORDER — ACETAMINOPHEN 325 MG PO TABS
650.0000 mg | ORAL_TABLET | Freq: Four times a day (QID) | ORAL | Status: DC | PRN
  Administered 2014-01-19: 650 mg via ORAL
  Filled 2014-01-17: qty 2

## 2014-01-17 MED ORDER — ONDANSETRON HCL 4 MG PO TABS: 4.0000 mg | ORAL_TABLET | Freq: Four times a day (QID) | ORAL | Status: DC | PRN

## 2014-01-17 MED ORDER — LATANOPROST 0.005 % OP SOLN
1.0000 [drp] | Freq: Every day | OPHTHALMIC | Status: DC
  Administered 2014-01-17 – 2014-01-18 (×2): 1 [drp] via OPHTHALMIC
  Filled 2014-01-17 (×2): qty 2.5

## 2014-01-17 MED ORDER — SODIUM CHLORIDE 0.9 % IV SOLN
Freq: Once | INTRAVENOUS | Status: AC
  Administered 2014-01-17: 13:00:00 via INTRAVENOUS

## 2014-01-17 MED ORDER — ONDANSETRON HCL 4 MG/2ML IJ SOLN: 4.0000 mg | Freq: Three times a day (TID) | INTRAMUSCULAR | Status: AC | PRN

## 2014-01-17 MED ORDER — CLOTRIMAZOLE-BETAMETHASONE 1-0.05 % EX CREA
TOPICAL_CREAM | Freq: Two times a day (BID) | CUTANEOUS | Status: DC
  Administered 2014-01-18 – 2014-01-19 (×3): via TOPICAL
  Filled 2014-01-17 (×2): qty 15

## 2014-01-17 MED ORDER — AMIODARONE HCL 200 MG PO TABS
200.0000 mg | ORAL_TABLET | Freq: Every day | ORAL | Status: DC
  Administered 2014-01-18 – 2014-01-20 (×3): 200 mg via ORAL
  Filled 2014-01-17 (×3): qty 1

## 2014-01-17 MED ORDER — ISOSORBIDE MONONITRATE ER 60 MG PO TB24
60.0000 mg | ORAL_TABLET | Freq: Every day | ORAL | Status: DC
  Administered 2014-01-18 – 2014-01-20 (×3): 60 mg via ORAL
  Filled 2014-01-17 (×3): qty 1

## 2014-01-17 MED ORDER — HEPARIN SODIUM (PORCINE) 5000 UNIT/ML IJ SOLN: 5000.0000 [IU] | Freq: Three times a day (TID) | INTRAMUSCULAR | Status: DC

## 2014-01-17 MED ORDER — ACETAMINOPHEN 650 MG RE SUPP: 650.0000 mg | Freq: Four times a day (QID) | RECTAL | Status: DC | PRN

## 2014-01-17 MED ORDER — SODIUM CHLORIDE 0.9 % IV SOLN
INTRAVENOUS | Status: DC
  Administered 2014-01-17 – 2014-01-18 (×3): via INTRAVENOUS

## 2014-01-17 MED ORDER — WARFARIN - PHARMACIST DOSING INPATIENT: Freq: Every day | Status: DC

## 2014-01-17 MED ORDER — ALUM & MAG HYDROXIDE-SIMETH 200-200-20 MG/5ML PO SUSP: 30.0000 mL | Freq: Four times a day (QID) | ORAL | Status: DC | PRN

## 2014-01-17 MED ORDER — FEBUXOSTAT 80 MG PO TABS
1.0000 | ORAL_TABLET | Freq: Every day | ORAL | Status: DC
  Administered 2014-01-18 – 2014-01-20 (×3): 80 mg via ORAL
  Filled 2014-01-17 (×3): qty 1

## 2014-01-17 MED ORDER — METOPROLOL TARTRATE 12.5 MG HALF TABLET
12.5000 mg | ORAL_TABLET | Freq: Two times a day (BID) | ORAL | Status: DC
  Administered 2014-01-17: 12.5 mg via ORAL
  Filled 2014-01-17 (×4): qty 1

## 2014-01-17 MED ORDER — ATORVASTATIN CALCIUM 40 MG PO TABS
40.0000 mg | ORAL_TABLET | Freq: Every day | ORAL | Status: DC
  Administered 2014-01-17 – 2014-01-19 (×3): 40 mg via ORAL
  Filled 2014-01-17 (×4): qty 1

## 2014-01-17 NOTE — H&P (Signed)
[facility] History and Physical  [name] IWL:[id number] DOB: [birth year] DOA: 01/17/2014   PCP: Mayra Neer, MD    Chief Complaint: Abnormal lab work  HPI: [name] is a 79 y.o. male with past medical history of gout, diabetes mellitus, hypertension, paroxysmal atrial fibrillation, chronic systolic heart failure, CVA who is nonambulatory. The patient was brought by his family to the ER 2 days ago for generalized weakness and a rash. Was also found to be bradycardic with a heart rate less than 50. The ER doctor spoke with Dr. Tamala Julian (cardiology) who advised cutting his metoprolol back to 12.5 mg twice a day. The patient was discharged home. Apparently the patient's PCP reviewed lab work that was done in the ER and called to his house today and recommended he come back to the ER because of acute renal failure. The patient states that he is otherwise feeling fine. According to the family the rashes most likely fungal and they have been applying prescribed ointment on it. The patient has no complaints.   General: has a poor appetite - had some weight loss initially but this has leveled off  Cardiac: Denies chest pain, syncope, palpitations, pedal edema  Respiratory: Denies cough, shortness of breath, wheezing GI: Denies severe indigestion/heartburn, abdominal pain, nausea, vomiting, diarrhea and constipation GU: Denies hematuria, incontinence, dysuria  Musculoskeletal: has arthritis "all over" Skin: has a rash on the right back which his doctor thinks is eczema  Neurologic: Denies focal weakness or numbness, change in vision Psychiatry: Denies depression or anxiety.   Past Medical History  Diagnosis Date  . Gout   . Diabetes mellitus   . Hypertension   . Hyperlipidemia   . Coronary artery disease   . Cerebral embolism   . Paroxysmal atrial fibrillation   . Chronic systolic heart failure   . Stroke     Past Surgical History  Procedure Laterality Date  .  Coronary artery bypass graft    . Cholecystectomy      Social History: does not smoke or drink alcohol  Lives at home with wife- he is been nonambulatory for over a year-family states that this is secondary to a severe gout attack he had. He is able to transfer from bed to chair only.   Allergies  Allergen Reactions  . Other Other (See Comments)    Ear drops  I used made my throat feel funny     Family History  Problem Relation Age of Onset  . Coronary artery disease Maternal Aunt       Prior to Admission medications   Medication Sig Start Date End Date Taking? Authorizing Provider  amiodarone (PACERONE) 200 MG tablet Take 1 tablet (200 mg total) by mouth daily. 10/17/13  Yes Belva Crome III, MD  aspirin EC 81 MG tablet Take 81 mg by mouth daily.   Yes Historical Provider, MD  atorvastatin (LIPITOR) 40 MG tablet Take 40 mg by mouth at bedtime.    Yes Historical Provider, MD  clonazePAM (KLONOPIN) 0.5 MG tablet Take 0.5 mg by mouth at bedtime as needed. For anxiety   Yes Historical Provider, MD  clotrimazole-betamethasone (LOTRISONE) cream Apply to affected area 2 times daily prn 01/15/14  Yes Lauren Jerline Pain, PA-C  colchicine 0.6 MG tablet Take 0.5 tablets (0.3 mg total) by mouth daily. 08/25/11  Yes Modena Jansky, MD  Febuxostat (ULORIC) 80 MG TABS Take 1 tablet by mouth daily.   Yes Historical Provider, MD  isosorbide mononitrate (IMDUR)  60 MG 24 hr tablet Take 1 tablet (60 mg total) by mouth daily. 08/01/13  Yes Belva Crome III, MD  latanoprost (XALATAN) 0.005 % ophthalmic solution Place 1 drop into both eyes at bedtime.   Yes Historical Provider, MD  metoprolol (LOPRESSOR) 12.5 mg TABS tablet Take 1 tablet (25 mg total) by mouth 2 (two) times daily. 01/15/14  Yes Harvie Heck, PA-C  potassium chloride (K-DUR,KLOR-CON) 10 MEQ tablet Take 1 tablet (10 mEq total) by mouth daily. 06/26/13  Yes Veblen, MD  spironolactone (ALDACTONE) 25 MG tablet TAKE 1 TABLET (25 MG TOTAL)  BY MOUTH DAILY. 09/24/13  Yes Belva Crome III, MD  torsemide (DEMADEX) 20 MG tablet Take 2 tablets (40 mg total) by mouth daily. 01/08/14  Yes Belva Crome III, MD  warfarin (COUMADIN) 2.5 MG tablet Take 1.25-2.5 mg by mouth daily at 6 PM. TAKES 2.5MG ON TUES ONLY TAKES 1.25MG ALL OTHER DAYS   Yes Historical Provider, MD     Physical Exam: Filed Vitals:   01/17/14 1230 01/17/14 1500 01/17/14 1515 01/17/14 1545  BP: 115/73 129/60 112/67 100/63  Pulse: 46 51  49  Resp: _0 SpO2: 100% 100% 100% 100%     General: Awake alert oriented 3, in no acute distress HEENT: Normocephalic and Atraumatic, Mucous membranes pink                PERRLA; EOM intact; No scleral icterus,                 Nares: Patent, Oropharynx: Clear, Fair Dentition                 Neck: FROM, no cervical lymphadenopathy, thyromegaly, carotid bruit or JVD;  Breasts: deferred CHEST WALL: No tenderness  CHEST: Normal respiration, clear to auscultation bilaterally  HEART: Regular rate and rhythm; no murmurs rubs or gallops  BACK: No kyphosis or scoliosis; no CVA tenderness  ABDOMEN: Positive Bowel Sounds, soft, non-tender; no masses, no organomegaly Rectal Exam: deferred EXTREMITIES: No cyanosis, clubbing, or edema Genitalia: not examined  SKIN:  no rash or ulceration  CNS: Alert and Oriented x 4, Nonfocal exam, CN 2-12 intact  Labs on Admission:  Basic Metabolic Panel:  Recent Labs Lab 01/15/14 1010 01/17/14 1315  NA 133* 132*  K 5.3 5.2  CL 98 96  CO2 19 20  GLUCOSE 147* 159*  BUN 79* 75*  CREATININE 3.14* 3.04*  CALCIUM 9.7 9.8   Liver Function Tests:  Recent Labs Lab 01/17/14 1315  AST 53*  ALT 64*  ALKPHOS 108  BILITOT 0.9  PROT 8.1  ALBUMIN 3.2*   No results for input(s): LIPASE, AMYLASE in the last 168 hours. No results for input(s): AMMONIA in the last 168 hours. CBC:  Recent Labs Lab 01/15/14 1010 01/17/14 1315  WBC 8.9 13.1*  NEUTROABS 6.9 11.0*  HGB 16.7 16.4   HCT 50.1 48.4  MCV 94.4 95.5  PLT 142* 146*   Cardiac Enzymes: No results for input(s): CKTOTAL, CKMB, CKMBINDEX, TROPONINI in the last 168 hours.  BNP (last 3 results) No results for input(s): PROBNP in the last 8760 hours. CBG:  Recent Labs Lab 01/17/14 1631  GLUCAP 172*    Radiological Exams on Admission: No results found.  EKG: Independently reviewed. A. fib at 51 bpm  Assessment/Plan Principal Problem:   Acute renal failure/ CKD (chronic kidney disease), stage III -Creatinine on 1/15 was 2.1 and now is 3.14 -Suspect that this  is secondary to diuretics which I will be holding -Will start slow hydration with IV fluids while watching his respiratory status carefully - possible that he may need a lower dose of diuretics upon discharge - recheck b met in the morning -Will also check a renal ultrasound  Active Problems:   Chronic systolic and diastolic heart failure -Echo from 2013 reveals an EF of 82-41% ,grade 3 diastolic dysfunction with moderate pulmonary hypertension and mildly reduced RV function -We'll repeat an echo to see if there has been any change    Gout -Continue colchicine-will ask pharmacy to renally dose this    Paroxysmal atrial fibrillation -Continue Coumadin -Continue amiodarone and metoprolol     Long term current use of anticoagulant therapy -Pharmacy to dose Coumadin    UTI (urinary tract infection) -Start Rocephin- culture sent in the ER  Diabetes mellitus -He is not on any medication for this at home and suspect this is diet controlled -We'll place on a low-dose insulin sliding scale for now  Thrombocytopenia -Mild-etiology currently uncertain-follow  Nonambulatory -PT eval to see if any progress can be made with ambulation  Consulted:   Code Status: DO NOT RESUSCITATE  Family Communication: With wife and son at bedside DVT Prophylaxis: Coumadin  Time spent:> 29 minutes  Bahja Bence, MD [facility]  If 7PM-7AM,  please contact night-coverage [email or web address] 01/17/2014, 6:12 PM

## 2014-01-17 NOTE — ED Notes (Signed)
Phlebotomy at bedside.

## 2014-01-17 NOTE — Progress Notes (Signed)
MEDICATION RELATED CONSULT NOTE  Pharmacy Consult for adjust medication for renal function  Allergies  Allergen Reactions  . Other Other (See Comments)    Ear drops  I used made my throat feel funny    Labs:  Recent Labs  01/15/14 1010 01/17/14 1315  WBC 8.9 13.1*  HGB 16.7 16.4  HCT 50.1 48.4  PLT 142* 146*  CREATININE 3.14* 3.04*  ALBUMIN  --  3.2*  PROT  --  8.1  AST  --  53*  ALT  --  64*  ALKPHOS  --  108  BILITOT  --  0.9   CrCl cannot be calculated (Unknown ideal weight.).   Medications:  Scheduled:  . [START ON 01/18/2014] amiodarone  200 mg Oral Daily  . [START ON 01/18/2014] aspirin EC  81 mg Oral Daily  . atorvastatin  40 mg Oral QHS  . clotrimazole-betamethasone   Topical BID  . [START ON 01/18/2014] colchicine  0.3 mg Oral Daily  . [START ON 01/18/2014] Febuxostat  1 tablet Oral Daily  . heparin  5,000 Units Subcutaneous 3 times per day  . [START ON 01/18/2014] isosorbide mononitrate  60 mg Oral Daily  . latanoprost  1 drop Both Eyes QHS  . metoprolol  25 mg Oral BID    Assessment: 79 y/o male who presented to the ED today for acute on chronic renal failure. Pharmacy consulted to review medications and adjust for renal function.   SCr 3.04, est CrCl ~20 ml/min normalized  Plan:  - All inpatient medications are appropriate for current renal function - Pharmacy signing off, please reconsult if needed  Surgcenter Of Silver Spring LLCJennifer Hillcrest, Holly RidgePharm.D., BCPS Clinical Pharmacist Pager: [phone] 01/17/2014 6:02 PM

## 2014-01-17 NOTE — ED Notes (Signed)
Family at bedside, asking about patient's status/plan, waiting on doctor.  Told family that lab results were pending, physician should be in shortly.

## 2014-01-17 NOTE — Progress Notes (Signed)
Pt arrived to unit. Oriented to floor. VSS. Daughter and son at bedside. Admitting doctor paged.

## 2014-01-17 NOTE — ED Notes (Signed)
Pt refusing foley catheter at this time. Dr. Hyacinth MeekerMiller notified, will try for clean catch urine sample.

## 2014-01-17 NOTE — ED Provider Notes (Signed)
CSN: [id number]     Arrival date & time 01/17/14  1139 History   First MD Initiated Contact with Patient 01/17/14 1141     Chief Complaint  Patient presents with  . Abnormal Lab     (Consider location/radiation/quality/duration/timing/severity/associated sxs/prior Treatment) HPI Comments: 79 year old male, history of chronic diabetes, hyperlipidemia, stroke, A. fib, chronic systolic heart failure. He presents to the hospital at the request of his family doctor, Dr. Cam HaiKimberly Shaw, he was seen in the emergency department 2 days ago during which time he was found to have acute on chronic renal failure with an elevated BUN, he had several other small abnormality is labs, the patient denies any significant symptoms other than burning when he urinates, he denies any fevers chills nausea vomiting. Symptoms are persistent,  The history is provided by the patient and medical records.    Past Medical History  Diagnosis Date  . Gout   . Diabetes mellitus   . Hypertension   . Hyperlipidemia   . Coronary artery disease   . Cerebral embolism   . Paroxysmal atrial fibrillation   . Chronic systolic heart failure   . Stroke    Past Surgical History  Procedure Laterality Date  . Coronary artery bypass graft    . Cholecystectomy     Family History  Problem Relation Age of Onset  . Coronary artery disease Maternal Aunt    History  Substance Use Topics  . Smoking status: Former Smoker -- 1.00 packs/day for 25 years    Types: Cigarettes    Quit date: 03/01/1968  . Smokeless tobacco: Not on file  . Alcohol Use: Not on file    Review of Systems  All other systems reviewed and are negative.     Allergies  Other  Home Medications   Prior to Admission medications   Medication Sig Start Date End Date Taking? Authorizing Provider  amiodarone (PACERONE) 200 MG tablet Take 1 tablet (200 mg total) by mouth daily. 10/17/13  Yes Lyn RecordsHenry W Smith III, MD  aspirin EC 81 MG tablet Take 81 mg by  mouth daily.   Yes Historical Provider, MD  atorvastatin (LIPITOR) 40 MG tablet Take 40 mg by mouth at bedtime.    Yes Historical Provider, MD  clonazePAM (KLONOPIN) 0.5 MG tablet Take 0.5 mg by mouth at bedtime as needed. For anxiety   Yes Historical Provider, MD  clotrimazole-betamethasone (LOTRISONE) cream Apply to affected area 2 times daily prn 01/15/14  Yes Lauren Jimmey RalphParker, PA-C  colchicine 0.6 MG tablet Take 0.5 tablets (0.3 mg total) by mouth daily. 08/25/11  Yes Elease EtienneAnand D Hongalgi, MD  Febuxostat (ULORIC) 80 MG TABS Take 1 tablet by mouth daily.   Yes Historical Provider, MD  isosorbide mononitrate (IMDUR) 60 MG 24 hr tablet Take 1 tablet (60 mg total) by mouth daily. 08/01/13  Yes Lyn RecordsHenry W Smith III, MD  latanoprost (XALATAN) 0.005 % ophthalmic solution Place 1 drop into both eyes at bedtime.   Yes Historical Provider, MD  metoprolol (LOPRESSOR) 12.5 mg TABS tablet Take 1 tablet (25 mg total) by mouth 2 (two) times daily. 01/15/14  Yes Mellody DrownLauren Parker, PA-C  potassium chloride (K-DUR,KLOR-CON) 10 MEQ tablet Take 1 tablet (10 mEq total) by mouth daily. 06/26/13  Yes Lyn RecordsHenry W Smith III, MD  spironolactone (ALDACTONE) 25 MG tablet TAKE 1 TABLET (25 MG TOTAL) BY MOUTH DAILY. 09/24/13  Yes Lyn RecordsHenry W Smith III, MD  torsemide (DEMADEX) 20 MG tablet Take 2 tablets (40 mg total) by mouth daily.  01/08/14  Yes Lyn RecordsHenry W Smith III, MD  warfarin (COUMADIN) 2.5 MG tablet Take 1.25-2.5 mg by mouth daily at 6 PM. TAKES 2.5MG  ON TUES ONLY TAKES 1.25MG  ALL OTHER DAYS   Yes Historical Provider, MD   BP 115/73 mmHg  Pulse 46  Resp 18  SpO2 100% Physical Exam  Constitutional: He appears well-developed and well-nourished. No distress.  HENT:  Head: Normocephalic and atraumatic.  Mouth/Throat: Oropharynx is clear and moist. No oropharyngeal exudate.  Eyes: Conjunctivae and EOM are normal. Pupils are equal, round, and reactive to light. Right eye exhibits no discharge. Left eye exhibits no discharge. No scleral icterus.   Neck: Normal range of motion. Neck supple. No JVD present. No thyromegaly present.  Cardiovascular: Regular rhythm, normal heart sounds and intact distal pulses.  Exam reveals no gallop and no friction rub.   No murmur heard. Ongoing bradycardia, pulse between 40 and 50  Pulmonary/Chest: Effort normal and breath sounds normal. No respiratory distress. He has no wheezes. He has no rales.  Abdominal: Soft. Bowel sounds are normal. He exhibits no distension and no mass. There is no tenderness.  Genitourinary:  Normal appearing penis, no discharge or rash or swelling  Musculoskeletal: Normal range of motion. He exhibits no edema or tenderness.  Lymphadenopathy:    He has no cervical adenopathy.  Neurological: He is alert. Coordination normal.  Mental status normal, moves all 4 extremities, normal interaction and coordination  Skin: Skin is warm and dry. No rash noted. No erythema.  Psychiatric: He has a normal mood and affect. His behavior is normal.  Nursing note and vitals reviewed.   ED Course  Procedures (including critical care time) Labs Review Labs Reviewed  CBC WITH DIFFERENTIAL - Abnormal; Notable for the following:    WBC 13.1 (*)    RDW 15.7 (*)    Platelets 146 (*)    Neutrophils Relative % 83 (*)    Neutro Abs 11.0 (*)    Lymphocytes Relative 9 (*)    All other components within normal limits  COMPREHENSIVE METABOLIC PANEL - Abnormal; Notable for the following:    Sodium 132 (*)    Glucose, Bld 159 (*)    BUN 75 (*)    Creatinine, Ser 3.04 (*)    Albumin 3.2 (*)    AST 53 (*)    ALT 64 (*)    GFR calc non Af Amer 18 (*)    GFR calc Af Amer 21 (*)    Anion gap 16 (*)    All other components within normal limits  URINE CULTURE  URINALYSIS, ROUTINE W REFLEX MICROSCOPIC    Imaging Review No results found.   EKG Interpretation   Date/Time:  Thursday January 17 2014 12:35:32 EST Ventricular Rate:  51 PR Interval:    QRS Duration: 124 QT Interval:   510 QTC Calculation: 470 R Axis:   15 Text Interpretation:  Atrial fibrillation Left bundle branch block  Junctional bradycardia Since last tracing rate slower Confirmed by Franchelle Foskett   MD, Adalena Abdulla ([id number]) on 01/17/2014 2:11:34 PM      MDM   Final diagnoses:  Acute renal failure, unspecified acute renal failure type    The patient has worsening renal function, we'll recheck labs, start fluids for likely dehydration, bradycardia is of concern as well, EKG does show T-wave inversions in the lateral leads,  The bradycardia has been persistent, the patient does not seem to be symptomatic from this as his blood pressure is normal. He has renal  failure which will be treated as an inpatient, I discussed this with the hospitalist who agrees.  Meds given in ED:  Medications  0.9 %  sodium chloride infusion ( Intravenous New Bag/Given 01/17/14 1247)    New Prescriptions   No medications on file      Vida Roller, MD 01/17/14 1521

## 2014-01-17 NOTE — Plan of Care (Signed)
Problem: Consults Goal: Skin Care Protocol Initiated - if Braden Score 18 or less If consults are not indicated, leave blank or document N/A Outcome: Completed/Met Date Met:  01/17/14  Problem: Phase I Progression Outcomes Goal: Voiding-avoid urinary catheter unless indicated Outcome: Completed/Met Date Met:  01/17/14

## 2014-01-17 NOTE — Progress Notes (Signed)
[facility] CONSULT NOTE - Initial Consult  Pharmacy Consult for warfarin Indication: atrial fibrillation  Allergies  Allergen Reactions  . Other Other (See Comments)    Ear drops  I used made my throat feel funny     Patient Measurements:    Vital Signs: BP: 100/63 mmHg (11/19 1545) Pulse Rate: 49 (11/19 1545)  Labs:  Recent Labs  01/15/14 1010 01/17/14 1315 01/17/14 2011  HGB 16.7 16.4  --   HCT 50.1 48.4  --   PLT 142* 146*  --   LABPROT 25.9*  --  28.8*  INR 2.35*  --  2.69*  CREATININE 3.14* 3.04*  --     CrCl cannot be calculated (Unknown ideal weight.).   Medical History: Past Medical History  Diagnosis Date  . Gout   . Diabetes mellitus   . Hypertension   . Hyperlipidemia   . Coronary artery disease   . Cerebral embolism   . Paroxysmal atrial fibrillation   . Chronic systolic heart failure   . Stroke     Medications:  Prescriptions prior to admission  Medication Sig Dispense Refill Last Dose  . amiodarone (PACERONE) 200 MG tablet Take 1 tablet (200 mg total) by mouth daily. 30 tablet 4 01/17/2014 at Unknown time  . aspirin EC 81 MG tablet Take 81 mg by mouth daily.   01/17/2014 at Unknown time  . atorvastatin (LIPITOR) 40 MG tablet Take 40 mg by mouth at bedtime.    01/16/2014 at Unknown time  . clonazePAM (KLONOPIN) 0.5 MG tablet Take 0.5 mg by mouth at bedtime as needed. For anxiety   Past Week at Unknown time  . clotrimazole-betamethasone (LOTRISONE) cream Apply to affected area 2 times daily prn 15 g 0 Past Week at Unknown time  . colchicine 0.6 MG tablet Take 0.5 tablets (0.3 mg total) by mouth daily.   01/17/2014 at Unknown time  . Febuxostat (ULORIC) 80 MG TABS Take 1 tablet by mouth daily.   01/17/2014 at Unknown time  . isosorbide mononitrate (IMDUR) 60 MG 24 hr tablet Take 1 tablet (60 mg total) by mouth daily. 30 tablet 6 01/17/2014 at Unknown time  . latanoprost (XALATAN) 0.005 % ophthalmic solution Place 1 drop into both eyes at  bedtime.   01/16/2014 at Unknown time  . metoprolol (LOPRESSOR) 12.5 mg TABS tablet Take 1 tablet (25 mg total) by mouth 2 (two) times daily. 30 tablet 0 01/17/2014 at Unknown time  . potassium chloride (K-DUR,KLOR-CON) 10 MEQ tablet Take 1 tablet (10 mEq total) by mouth daily. 30 tablet 6 01/17/2014 at Unknown time  . spironolactone (ALDACTONE) 25 MG tablet TAKE 1 TABLET (25 MG TOTAL) BY MOUTH DAILY. 30 tablet 6 01/17/2014 at Unknown time  . torsemide (DEMADEX) 20 MG tablet Take 2 tablets (40 mg total) by mouth daily. 60 tablet 5 01/17/2014 at Unknown time  . warfarin (COUMADIN) 2.5 MG tablet Take 1.25-2.5 mg by mouth daily at 6 PM. TAKES 2.5MG  ON TUES ONLY TAKES 1.25MG  ALL OTHER DAYS   01/16/2014 at Unknown time    Assessment: 79 y/o male sent to the ED for abnormal labs results. He also had an ED visit on 11/17 for weakness and rash. He takes chronic warfarin for Afib. INR is therapeutic at 2.69 tonight. No bleeding noted, H/H are normal, platelets are just below normal range.  PTA: 1.25 mg daily except 2.5 mg on Tues - last dose at home was 11/18.  Goal of Therapy:  INR 2-3 Monitor platelets by [facility]  protocol: Yes   Plan:  - Warfarin 1.25 mg PO tonight - INR daily - Monitor for s/sx of bleeding  [facility] Conval[facility] HospitalJennifer St. Marys, [address]Pharm.D., BCPS Clinical Pharmacist Pager: [phone] 01/17/2014 8:51 PM

## 2014-01-17 NOTE — ED Notes (Signed)
Per ems pt was called by doctor and told to go back to the hospital to be admitted for abnormal renal function tests.

## 2014-01-18 DIAGNOSIS — N179 Acute kidney failure, unspecified: Secondary | ICD-10-CM | POA: Insufficient documentation

## 2014-01-18 DIAGNOSIS — I059 Rheumatic mitral valve disease, unspecified: Secondary | ICD-10-CM

## 2014-01-18 DIAGNOSIS — I669 Occlusion and stenosis of unspecified cerebral artery: Secondary | ICD-10-CM

## 2014-01-18 DIAGNOSIS — I480 Paroxysmal atrial fibrillation: Secondary | ICD-10-CM

## 2014-01-18 LAB — CBC
HCT: 48.3 % (ref 39.0–52.0)
HEMOGLOBIN: 16.1 g/dL (ref 13.0–17.0)
MCH: 31.9 pg (ref 26.0–34.0)
MCHC: 33.3 g/dL (ref 30.0–36.0)
MCV: 95.8 fL (ref 78.0–100.0)
Platelets: 145 10*3/uL — ABNORMAL LOW (ref 150–400)
RBC: 5.04 MIL/uL (ref 4.22–5.81)
RDW: 15.6 % — AB (ref 11.5–15.5)
WBC: 14.7 10*3/uL — ABNORMAL HIGH (ref 4.0–10.5)

## 2014-01-18 LAB — BASIC METABOLIC PANEL
ANION GAP: 18 — AB (ref 5–15)
ANION GAP: 16 — AB (ref 5–15)
BUN: 72 mg/dL — ABNORMAL HIGH (ref 6–23)
BUN: 68 mg/dL — ABNORMAL HIGH (ref 6–23)
CALCIUM: 9.4 mg/dL (ref 8.4–10.5)
CHLORIDE: 98 meq/L (ref 96–112)
CHLORIDE: 101 meq/L (ref 96–112)
CO2: 17 mEq/L — ABNORMAL LOW (ref 19–32)
CO2: 15 mEq/L — ABNORMAL LOW (ref 19–32)
Calcium: 9 mg/dL (ref 8.4–10.5)
Creatinine, Ser: 2.95 mg/dL — ABNORMAL HIGH (ref 0.50–1.35)
Creatinine, Ser: 2.61 mg/dL — ABNORMAL HIGH (ref 0.50–1.35)
GFR calc non Af Amer: 19 mL/min — ABNORMAL LOW (ref >90)
GFR calc non Af Amer: 22 mL/min — ABNORMAL LOW (ref >90)
GFR, EST AFRICAN AMERICAN: 22 mL/min — AB (ref >90)
GFR, EST AFRICAN AMERICAN: 26 mL/min — AB (ref >90)
Glucose, Bld: 143 mg/dL — ABNORMAL HIGH (ref 70–99)
Glucose, Bld: 178 mg/dL — ABNORMAL HIGH (ref 70–99)
Potassium: 5.9 mEq/L — ABNORMAL HIGH (ref 3.7–5.3)
Potassium: 4.8 mEq/L (ref 3.7–5.3)
Sodium: 133 mEq/L — ABNORMAL LOW (ref 137–147)
Sodium: 132 mEq/L — ABNORMAL LOW (ref 137–147)

## 2014-01-18 LAB — PROTIME-INR
INR: 2.56 — ABNORMAL HIGH (ref 0.00–1.49)
PROTHROMBIN TIME: 27.7 s — AB (ref 11.6–15.2)

## 2014-01-18 LAB — GLUCOSE, CAPILLARY
GLUCOSE-CAPILLARY: 142 mg/dL — AB (ref 70–99)
GLUCOSE-CAPILLARY: 165 mg/dL — AB (ref 70–99)
Glucose-Capillary: 141 mg/dL — ABNORMAL HIGH (ref 70–99)
Glucose-Capillary: 160 mg/dL — ABNORMAL HIGH (ref 70–99)

## 2014-01-18 MED ORDER — METOPROLOL TARTRATE 12.5 MG HALF TABLET
12.5000 mg | ORAL_TABLET | Freq: Two times a day (BID) | ORAL | Status: DC
  Administered 2014-01-19: 12.5 mg via ORAL
  Filled 2014-01-18 (×5): qty 1

## 2014-01-18 MED ORDER — WARFARIN 1.25 MG HALF TABLET
1.2500 mg | ORAL_TABLET | Freq: Once | ORAL | Status: AC
  Administered 2014-01-18: 1.25 mg via ORAL
  Filled 2014-01-18: qty 1

## 2014-01-18 MED ORDER — SODIUM POLYSTYRENE SULFONATE 15 GM/60ML PO SUSP
30.0000 g | Freq: Once | ORAL | Status: AC
  Administered 2014-01-18: 30 g via ORAL
  Filled 2014-01-18: qty 120

## 2014-01-18 NOTE — Progress Notes (Signed)
  Echocardiogram 2D Echocardiogram has been performed.  [name], [name] 01/18/2014, 11:45 AM

## 2014-01-18 NOTE — Progress Notes (Addendum)
[facility] PROGRESS NOTE  [name] WUJ:[id number] DOB: 1934-12-26 DOA: 01/17/2014 PCP: [facility] RaiderSHAW,[name], MD  Assessment/Plan: Acute renal failure/ CKD (chronic kidney disease), stage III -Creatinine on 1/15 was 2.1 and on admission was 3.14 -Suspect that this is secondary to diuretics, currently  -continue gentle IVF while closely monitoring resp status -follow Bmet, weights, I/Os -Renal ultrasound without hydronephrosis   Hyperkalemia -due to 1, kayexalate now -Bmet later today   Chronic systolic and diastolic heart failure -Echo from 2013 reveals an EF of 10-15% ,grade 3 diastolic dysfunction with moderate pulmonary hypertension and mildly reduced RV function -Repeat ECHo pending   Gout -Continue colchicine-renal dose   Paroxysmal atrial fibrillation -Continue Coumadin -Continue amiodarone and metoprolol   UTI (urinary tract infection) -continue  Rocephin -Fu urine Cx  Diabetes mellitus -He is not on any medication for this at home  -SSI  Thrombocytopenia -Mild-etiology currently uncertain-follow  Nonambulatory/Bed bound at baseline -PT eval to see if any progress can be made with ambulation  Small R renal cyst like lesion -needs outpatient FU  Code Status: DNR Family Communication: none at bedside Disposition Plan: home when stable   HPI/Subjective: Feels ok, no complaints, no dyspnea  Objective: Filed Vitals:   01/18/14 0933  BP: 116/67  Pulse: 50  Temp:   Resp:    No intake or output data in the 24 hours ending 01/18/14 1137 There were no vitals filed for this visit.  Exam:   General:  AAOx3, chronically ill  Cardiovascular: S1S2/RRR  Respiratory: CTAB  Abdomen: soft, NT, BS present  Musculoskeletal: no edema c/c   Data Reviewed: Basic Metabolic Panel:  Recent Labs Lab 01/15/14 1010 01/17/14 1315 01/18/14 0643  NA 133* 132* 133*  K 5.3 5.2 5.9*  CL 98 96 98  CO2 19 20 17*  GLUCOSE 147* 159* 143*  BUN 79*  75* 72*  CREATININE 3.14* 3.04* 2.95*  CALCIUM 9.7 9.8 9.4   Liver Function Tests:  Recent Labs Lab 01/17/14 1315  AST 53*  ALT 64*  ALKPHOS 108  BILITOT 0.9  PROT 8.1  ALBUMIN 3.2*   No results for input(s): LIPASE, AMYLASE in the last 168 hours. No results for input(s): AMMONIA in the last 168 hours. CBC:  Recent Labs Lab 01/15/14 1010 01/17/14 1315 01/18/14 0643  WBC 8.9 13.1* 14.7*  NEUTROABS 6.9 11.0*  --   HGB 16.7 16.4 16.1  HCT 50.1 48.4 48.3  MCV 94.4 95.5 95.8  PLT 142* 146* 145*   Cardiac Enzymes: No results for input(s): CKTOTAL, CKMB, CKMBINDEX, TROPONINI in the last 168 hours. BNP (last 3 results) No results for input(s): PROBNP in the last 8760 hours. CBG:  Recent Labs Lab 01/17/14 1631 01/17/14 2341 01/18/14 0754  GLUCAP 172* 165* 142*    No results found for this or any previous visit (from the past 240 hour(s)).   Studies: [place] [facility]  01/17/2014   CLINICAL DATA:  Acute renal failure  EXAM: RENAL/URINARY TRACT ULTRASOUND COMPLETE  COMPARISON:  Abdominal ultrasound 12/09/2008, MR abdomen 01/10/2009  FINDINGS: Right Kidney:  Length: 11.4 cm. Cortical thinning. Increased cortical echogenicity. Hypoechoic nodule at inferior pole 1.7 x 1.3 x 1.5 cm, containing scattered low level internal echoes. No additional mass, hydronephrosis or shadowing calcification.  Left Kidney:  Length: 11.4 cm. Limited visualization due to body habitus and limited range of motion. Cortical thinning. Increased cortical echogenicity. Upper pole inadequately visualized. Visualized portion shows no gross evidence of mass or hydronephrosis.  Bladder:  Contains minimal urine,  unremarkable.  IMPRESSION: Incomplete visualization of LEFT kidney due to limitations imposed by a body habitus and limited range of motion.  Upper pole cystic lesion LEFT kidney seen on prior imaging could not be visualized on today's study due to limitations.  Medical renal disease changes of both kidneys  without hydronephrosis.  Small hypoechoic lesion at inferior RIGHT kidney, minimally increased in size from prior MR, likely small cyst.   Electronically Signed   By: [facility] SouthwardMark  Hill M.D.   On: 01/17/2014 22:14    Scheduled Meds: . amiodarone  200 mg Oral Daily  . aspirin EC  81 mg Oral Daily  . atorvastatin  40 mg Oral QHS  . cefTRIAXone (ROCEPHIN)  IV  1 g Intravenous Q24H  . clotrimazole-betamethasone   Topical BID  . colchicine  0.3 mg Oral Daily  . Febuxostat  1 tablet Oral Daily  . insulin aspart  0-5 Units Subcutaneous QHS  . insulin aspart  0-9 Units Subcutaneous TID WC  . isosorbide mononitrate  60 mg Oral Daily  . latanoprost  1 drop Both Eyes QHS  . metoprolol  12.5 mg Oral BID  . warfarin  1.25 mg Oral ONCE-1800  . Warfarin - Pharmacist Dosing Inpatient   Does not apply q1800   Continuous Infusions: . sodium chloride 75 mL/hr at 01/18/14 [id number]   Antibiotics Given (last 72 hours)    Date/Time Action Medication Dose Rate   01/17/14 1846 Given   cefTRIAXone (ROCEPHIN) 1 g in dextrose 5 % 50 mL IVPB - Premix 1 g 100 mL/hr      Principal Problem:   Acute renal failure Active Problems:   Chronic systolic heart failure   Gout   Paroxysmal atrial fibrillation   CKD (chronic kidney disease), stage III   Long term current use of anticoagulant therapy   UTI (urinary tract infection)    Time spent: 25min    [facility] PLLCJOSEPH,Nyazia Hill  [facility] Pager [phone]. If 7PM-7AM, please contact night-coverage at [email or web address], password [email or web address] 01/18/2014, 11:37 AM  LOS: 1 day

## 2014-01-18 NOTE — Progress Notes (Signed)
ANTICOAGULATION CONSULT NOTE - Initial Consult  Pharmacy Consult for warfarin Indication: atrial fibrillation  Allergies  Allergen Reactions  . Other Other (See Comments)    Ear drops  I used made my throat feel funny     Patient Measurements:    Vital Signs: Temp: 97.9 F (36.6 C) (11/20 0623) Temp Source: Oral (11/20 0623) BP: 95/41 mmHg (11/20 0623) Pulse Rate: 54 (11/20 0623)  Labs:  Recent Labs  01/15/14 1010 01/17/14 1315 01/17/14 2011 01/18/14 0643  HGB 16.7 16.4  --  16.1  HCT 50.1 48.4  --  48.3  PLT 142* 146*  --  145*  LABPROT 25.9*  --  28.8* 27.7*  INR 2.35*  --  2.69* 2.56*  CREATININE 3.14* 3.04*  --   --     CrCl cannot be calculated (Unknown ideal weight.).   Medical History: Past Medical History  Diagnosis Date  . Gout   . Diabetes mellitus   . Hypertension   . Hyperlipidemia   . Coronary artery disease   . Cerebral embolism   . Paroxysmal atrial fibrillation   . Chronic systolic heart failure   . Stroke     Medications:  Prescriptions prior to admission  Medication Sig Dispense Refill Last Dose  . amiodarone (PACERONE) 200 MG tablet Take 1 tablet (200 mg total) by mouth daily. 30 tablet 4 01/17/2014 at Unknown time  . aspirin EC 81 MG tablet Take 81 mg by mouth daily.   01/17/2014 at Unknown time  . atorvastatin (LIPITOR) 40 MG tablet Take 40 mg by mouth at bedtime.    01/16/2014 at Unknown time  . clonazePAM (KLONOPIN) 0.5 MG tablet Take 0.5 mg by mouth at bedtime as needed. For anxiety   Past Week at Unknown time  . clotrimazole-betamethasone (LOTRISONE) cream Apply to affected area 2 times daily prn 15 g 0 Past Week at Unknown time  . colchicine 0.6 MG tablet Take 0.5 tablets (0.3 mg total) by mouth daily.   01/17/2014 at Unknown time  . Febuxostat (ULORIC) 80 MG TABS Take 1 tablet by mouth daily.   01/17/2014 at Unknown time  . isosorbide mononitrate (IMDUR) 60 MG 24 hr tablet Take 1 tablet (60 mg total) by mouth daily. 30 tablet  6 01/17/2014 at Unknown time  . latanoprost (XALATAN) 0.005 % ophthalmic solution Place 1 drop into both eyes at bedtime.   01/16/2014 at Unknown time  . metoprolol (LOPRESSOR) 12.5 mg TABS tablet Take 1 tablet (25 mg total) by mouth 2 (two) times daily. 30 tablet 0 01/17/2014 at Unknown time  . potassium chloride (K-DUR,KLOR-CON) 10 MEQ tablet Take 1 tablet (10 mEq total) by mouth daily. 30 tablet 6 01/17/2014 at Unknown time  . spironolactone (ALDACTONE) 25 MG tablet TAKE 1 TABLET (25 MG TOTAL) BY MOUTH DAILY. 30 tablet 6 01/17/2014 at Unknown time  . torsemide (DEMADEX) 20 MG tablet Take 2 tablets (40 mg total) by mouth daily. 60 tablet 5 01/17/2014 at Unknown time  . warfarin (COUMADIN) 2.5 MG tablet Take 1.25-2.5 mg by mouth daily at 6 PM. TAKES 2.5MG  ON TUES ONLY TAKES 1.25MG  ALL OTHER DAYS   01/16/2014 at Unknown time    Assessment: 79 y/o male sent to the ED for abnormal labs results. He also had an ED visit on 11/17 for weakness and rash. He takes chronic warfarin for Afib. INR remains therapeutic  PTA: 1.25 mg daily except 2.5 mg on Tues - last dose at home was 11/18.  Goal of Therapy:  INR 2-3 Monitor platelets by anticoagulation protocol: Yes   Plan:  - Warfarin 1.25 mg PO tonight - INR daily - Monitor for s/sx of bleeding  Thanks for allowing pharmacy to be a part of this patient's care.  Talbert CageLora Kaikoa Magro, PharmD Clinical Pharmacist, [phone] 01/18/2014 8:12 AM

## 2014-01-18 NOTE — Progress Notes (Signed)
PT Cancellation Note  Patient Details Name: Francine GravenLonnie L Wolfer SR MRN: [id number] DOB: 12-Jul-1934   Cancelled Treatment:    Reason Eval/Treat Not Completed: Fatigue/lethargy limiting ability to participate.  Pt initially agreeable to therapy and when session began pt became agitated, refusing to participate. States "I am in the hospital and I don't feel like dealing with all this right now." Will continue to check back as schedule allows to complete PT eval.    Conni SlipperKirkman, Obie Kallenbach 01/18/2014, 10:55 AM   Conni SlipperLaura Lasasha Brophy, PT, DPT Acute Rehabilitation Services Pager: [phone]

## 2014-01-19 LAB — GLUCOSE, CAPILLARY
GLUCOSE-CAPILLARY: 134 mg/dL — AB (ref 70–99)
GLUCOSE-CAPILLARY: 145 mg/dL — AB (ref 70–99)
GLUCOSE-CAPILLARY: 142 mg/dL — AB (ref 70–99)
Glucose-Capillary: 178 mg/dL — ABNORMAL HIGH (ref 70–99)

## 2014-01-19 LAB — BASIC METABOLIC PANEL
Anion gap: 15 (ref 5–15)
BUN: 59 mg/dL — AB (ref 6–23)
CALCIUM: 8.9 mg/dL (ref 8.4–10.5)
CO2: 18 mEq/L — ABNORMAL LOW (ref 19–32)
Chloride: 104 mEq/L (ref 96–112)
Creatinine, Ser: 2.47 mg/dL — ABNORMAL HIGH (ref 0.50–1.35)
GFR calc Af Amer: 27 mL/min — ABNORMAL LOW (ref >90)
GFR, EST NON AFRICAN AMERICAN: 24 mL/min — AB (ref >90)
Glucose, Bld: 142 mg/dL — ABNORMAL HIGH (ref 70–99)
Potassium: 5.2 mEq/L (ref 3.7–5.3)
Sodium: 137 mEq/L (ref 137–147)

## 2014-01-19 LAB — PROTIME-INR
INR: 2.44 — ABNORMAL HIGH (ref 0.00–1.49)
Prothrombin Time: 26.7 seconds — ABNORMAL HIGH (ref 11.6–15.2)

## 2014-01-19 MED ORDER — WARFARIN 1.25 MG HALF TABLET
1.2500 mg | ORAL_TABLET | Freq: Once | ORAL | Status: AC
  Administered 2014-01-19: 1.25 mg via ORAL
  Filled 2014-01-19: qty 1

## 2014-01-19 NOTE — Progress Notes (Signed)
[facility] PROGRESS NOTE  Francine GravenLonnie L Vorce SR ZOX:[id number] DOB: 16-Jun-1934 DOA: 01/17/2014 PCP: [name],[name], MD  Assessment/Plan: Acute renal failure/ CKD (chronic kidney disease), stage III -Creatinine on 1/15 was 2.1 and on admission was 3.14 -Suspect that this is secondary to diuretics,  -improving, stop IVF -hope to resume PO diuretics by tomorrow -follow Bmet, weights, I/Os -Renal ultrasound without hydronephrosis   Hyperkalemia -due to 1, kayexalate given 11/20 -reoslved   Chronic systolic and diastolic heart failure -Echo from 2013 reveals an EF of 10-15% ,grade 3 diastolic dysfunction with moderate pulmonary hypertension and mildly reduced RV function -Repeat ECHo with improvement in EF to 25% -resume diuretics tomorrow   Gout -Continue colchicine-renal dose   Paroxysmal atrial fibrillation -Continue Coumadin -Continue amiodarone and metoprolol   UTI (urinary tract infection) -continue  Rocephin, Urine Cx with 100K GNR    Diabetes mellitus -stable, diet controlled  -SSI    Thrombocytopenia -Mild-etiology currently uncertain-follow  Nonambulatory/Bed bound at baseline -PT eval pending  Small R renal cyst like lesion -needs outpatient FU  Code Status: DNR Family Communication: none at bedside Disposition Plan: home when stable, possibly tomorrow   HPI/Subjective: Feels ok, no complaints, no dyspnea  Objective: Filed Vitals:   01/19/14 0500  BP: 108/64  Pulse: 53  Temp: 98.2 F (36.8 C)  Resp: 18    Intake/Output Summary (Last 24 hours) at 01/19/14 1025 Last data filed at 01/19/14 0500  Gross per 24 hour  Intake   3325 ml  Output    175 ml  Net   3150 ml   Filed Weights   01/18/14 2058  Weight: 108.4 kg (238 lb 15.7 oz)    Exam:   General:  AAOx3, chronically ill  Cardiovascular: S1S2/RRR  Respiratory: CTAB  Abdomen: soft, NT, BS present  Musculoskeletal: no edema c/c   Data Reviewed: Basic Metabolic  Panel:  Recent Labs Lab 01/15/14 1010 01/17/14 1315 01/18/14 0643 01/18/14 1600 01/19/14 0550  NA 133* 132* 133* 132* 137  K 5.3 5.2 5.9* 4.8 5.2  CL 98 96 98 101 104  CO2 19 20 17* 15* 18*  GLUCOSE 147* 159* 143* 178* 142*  BUN 79* 75* 72* 68* 59*  CREATININE 3.14* 3.04* 2.95* 2.61* 2.47*  CALCIUM 9.7 9.8 9.4 9.0 8.9   Liver Function Tests:  Recent Labs Lab 01/17/14 1315  AST 53*  ALT 64*  ALKPHOS 108  BILITOT 0.9  PROT 8.1  ALBUMIN 3.2*   No results for input(s): LIPASE, AMYLASE in the last 168 hours. No results for input(s): AMMONIA in the last 168 hours. CBC:  Recent Labs Lab 01/15/14 1010 01/17/14 1315 01/18/14 0643  WBC 8.9 13.1* 14.7*  NEUTROABS 6.9 11.0*  --   HGB 16.7 16.4 16.1  HCT 50.1 48.4 48.3  MCV 94.4 95.5 95.8  PLT 142* 146* 145*   Cardiac Enzymes: No results for input(s): CKTOTAL, CKMB, CKMBINDEX, TROPONINI in the last 168 hours. BNP (last 3 results) No results for input(s): PROBNP in the last 8760 hours. CBG:  Recent Labs Lab 01/17/14 2341 01/18/14 0754 01/18/14 1331 01/18/14 1638 01/19/14 0740  GLUCAP 165* 142* 141* 160* 134*    Recent Results (from the past 240 hour(s))  Urine culture     Status: None (Preliminary result)   Collection Time: 01/17/14  2:51 PM  Result Value Ref Range Status   Specimen Description URINE, CLEAN CATCH  Final   Special Requests NONE  Final   Culture  Setup Time   Final  01/17/2014 22:33 Performed at [facility]    Colony Count   Final    >=100,000 COLONIES/ML Performed at [facility]    Culture   Final    GRAM NEGATIVE RODS Performed at [facility]    Report Status PENDING  Incomplete     Studies: [facility]  01/17/2014   CLINICAL DATA:  Acute renal failure  EXAM: RENAL/URINARY TRACT ULTRASOUND COMPLETE  COMPARISON:  Abdominal ultrasound 12/09/2008, MR abdomen 01/10/2009  FINDINGS: Right Kidney:  Length: 11.4 cm. Cortical thinning. Increased cortical  echogenicity. Hypoechoic nodule at inferior pole 1.7 x 1.3 x 1.5 cm, containing scattered low level internal echoes. No additional mass, hydronephrosis or shadowing calcification.  Left Kidney:  Length: 11.4 cm. Limited visualization due to body habitus and limited range of motion. Cortical thinning. Increased cortical echogenicity. Upper pole inadequately visualized. Visualized portion shows no gross evidence of mass or hydronephrosis.  Bladder:  Contains minimal urine, unremarkable.  IMPRESSION: Incomplete visualization of LEFT kidney due to limitations imposed by a body habitus and limited range of motion.  Upper pole cystic lesion LEFT kidney seen on prior imaging could not be visualized on today's study due to limitations.  Medical renal disease changes of both kidneys without hydronephrosis.  Small hypoechoic lesion at inferior RIGHT kidney, minimally increased in size from prior MR, likely small cyst.   Electronically Signed   By: Ulyses SouthwardMark  Boles M.D.   On: 01/17/2014 22:14    Scheduled Meds: . amiodarone  200 mg Oral Daily  . aspirin EC  81 mg Oral Daily  . atorvastatin  40 mg Oral QHS  . cefTRIAXone (ROCEPHIN)  IV  1 g Intravenous Q24H  . clotrimazole-betamethasone   Topical BID  . colchicine  0.3 mg Oral Daily  . Febuxostat  1 tablet Oral Daily  . insulin aspart  0-5 Units Subcutaneous QHS  . insulin aspart  0-9 Units Subcutaneous TID WC  . isosorbide mononitrate  60 mg Oral Daily  . latanoprost  1 drop Both Eyes QHS  . metoprolol  12.5 mg Oral BID  . Warfarin - Pharmacist Dosing Inpatient   Does not apply q1800   Continuous Infusions:   Antibiotics Given (last 72 hours)    Date/Time Action Medication Dose Rate   01/17/14 1846 Given   cefTRIAXone (ROCEPHIN) 1 g in dextrose 5 % 50 mL IVPB - Premix 1 g 100 mL/hr   01/18/14 2101 Given   cefTRIAXone (ROCEPHIN) 1 g in dextrose 5 % 50 mL IVPB - Premix 1 g 100 mL/hr      Principal Problem:   Acute renal failure Active Problems:    Chronic systolic heart failure   Gout   Paroxysmal atrial fibrillation   CKD (chronic kidney disease), stage III   Long term current use of anticoagulant therapy   UTI (urinary tract infection)   Acute renal failure syndrome    Time spent: 25min    [facility] Kansas CityJOSEPH,Jayln Madeira  [facility] Pager [phone]. If 7PM-7AM, please contact night-coverage at [email or web address], password [email or web address] 01/19/2014, 10:25 AM  LOS: 2 days

## 2014-01-19 NOTE — Evaluation (Signed)
Physical Therapy Evaluation Patient Details Name: [name] MRN: [id number] DOB: [birth year] Today's Date: 01/19/2014   History of Present Illness  [name] is a 79 y.o. male with past medical history of gout, diabetes mellitus, hypertension, paroxysmal atrial fibrillation, chronic systolic heart failure, CVA who is nonambulatory. The patient was brought by his family to the ER 2 days ago for generalized weakness and a rash, dc home and then called to return back for decreased renal function.  Clinical Impression  Patient seen for evaluation of mobility.  Patient +2 max to total assist to come to EOB, during sitting EOB with therapist and tech providing support, patient thrust himself backwards into therapist causing therapist leg and hip to be caught in bedrail with patient continues to thrust posteriorly until therapist broke lose from support, patient repositioned safely in bed, no injury to patient.  Patient educated on safety of all parties involved with positioning and mobility.  At this time patient unsafe for manual transfers, recommend nsg use lift for patient OOB to chair. Educated patient on importance of OOB as well as risk factors for skin break down and lung issues.  Given patients current level of assist and decreased participation, feel that it is not safe for patient to return home (currently +2 max to total assist) Recommending SNF.    Follow Up Recommendations SNF    Equipment Recommendations       Recommendations for Other Services       Precautions / Restrictions Precautions Precautions: Fall Restrictions Weight Bearing Restrictions: No      Mobility  Bed Mobility Overal bed mobility: +2 for physical assistance;Needs Assistance Bed Mobility: Supine to Sit;Sit to Supine     Supine to sit: Max assist;Total assist;+2 for physical assistance Sit to supine: Max assist;Total assist;+2 for physical assistance   General bed mobility comments: during  sitting EOB, patient quickly became very irritated and thrusted himself backwards into therapist behind him, no ability to regulate behavior or adjust positioning  Transfers                 General transfer comment: pt refusing and unsafe to perform  Ambulation/Gait                Stairs            Wheelchair Mobility    Modified Rankin (Stroke Patients Only)       Balance Overall balance assessment: Needs assistance Sitting-balance support: Feet supported Sitting balance-Leahy Scale: Zero (to poor) Sitting balance - Comments: patient with poor ability to control trunk, max assist +2 for EOB Postural control: Posterior lean                                   Pertinent Vitals/Pain      Home Living Family/patient expects to be discharged to:: Private residence Living Arrangements: Children;Spouse/significant other Available Help at Discharge: Other (Comment) (Unsure at this time - wife unable to help) Type of Home: Apartment           Additional Comments: patient with questionable reliance as historian, reported he is in an apt then reported he is not in an apartment that he move to a house but didnt know if there were steps or not.    Prior Function           Comments: unknown, pt reports he was able to get out of  bed and stand to get into the w/c     Hand Dominance        Extremity/Trunk Assessment   Upper Extremity Assessment: Defer to OT evaluation           Lower Extremity Assessment: Generalized weakness;RLE deficits/detail;LLE deficits/detail RLE Deficits / Details: limited range, poor active movement, noted muscle wasting LLE Deficits / Details: limited range, poor active movement, noted muscle wasting  Cervical / Trunk Assessment:  (increased body habitus)  Communication   Communication: HOH (visual deficits)  Cognition Arousal/Alertness: Awake/alert Behavior During Therapy: Flat affect Overall Cognitive  Status: No family/caregiver present to determine baseline cognitive functioning       Memory: Decreased short-term memory              General Comments      Exercises        Assessment/Plan    PT Assessment Patient needs continued PT services  PT Diagnosis Generalized weakness   PT Problem List Decreased strength;Decreased activity tolerance;Decreased balance;Decreased mobility;Decreased cognition;Decreased safety awareness;Obesity  PT Treatment Interventions Functional mobility training;Therapeutic activities;Therapeutic exercise;Balance training;Patient/family education;Wheelchair mobility training   PT Goals (Current goals can be found in the Care Plan section) Acute Rehab PT Goals Patient Stated Goal: none stated PT Goal Formulation: With patient Time For Goal Achievement: 02/02/14 Potential to Achieve Goals: Fair    Frequency Min 2X/week   Barriers to discharge Inaccessible home environment;Decreased caregiver support      Co-evaluation               End of Session   Activity Tolerance: Other (comment) (pt refusing further mobility, therapist unable to continue) Patient left: in bed;with call bell/phone within reach;with bed alarm set Nurse Communication: Mobility status;Need for lift equipment         Time: [id number] PT Time Calculation (min) (ACUTE ONLY): 15 min   Charges:   PT Evaluation $Initial PT Evaluation Tier I: 1 Procedure PT Treatments $Therapeutic Activity: 8-22 mins   PT G CodesFabio Hill:          [name] 01/19/2014, 11:10 AM [name], PT DPT  [phone]

## 2014-01-19 NOTE — Plan of Care (Signed)
Problem: Phase II Progression Outcomes Goal: IV changed to normal saline lock Outcome: Completed/Met Date Met:  01/19/14 Goal: Obtain order to discontinue catheter if appropriate Outcome: Not Applicable Date Met:  01/19/14     

## 2014-01-19 NOTE — Progress Notes (Signed)
ANTICOAGULATION CONSULT NOTE - Initial Consult  Pharmacy Consult for warfarin Indication: atrial fibrillation  Allergies  Allergen Reactions  . Other Other (See Comments)    Ear drops  I used made my throat feel funny     Patient Measurements: Height: 6\' 3"  (190.5 cm) Weight: 238 lb 15.7 oz (108.4 kg) IBW/kg (Calculated) : 84.5  Vital Signs: Temp: 97.7 F (36.5 C) (11/21 1041) Temp Source: Oral (11/21 1041) BP: 103/60 mmHg (11/21 1041) Pulse Rate: 61 (11/21 1041)  Labs:  Recent Labs  01/17/14 1315 01/17/14 2011 01/18/14 0643 01/18/14 1600 01/19/14 0550  HGB 16.4  --  16.1  --   --   HCT 48.4  --  48.3  --   --   PLT 146*  --  145*  --   --   LABPROT  --  28.8* 27.7*  --  26.7*  INR  --  2.69* 2.56*  --  2.44*  CREATININE 3.04*  --  2.95* 2.61* 2.47*    Estimated Creatinine Clearance: 33.3 mL/min (by C-G formula based on Cr of 2.47).   Medical History: Past Medical History  Diagnosis Date  . Gout   . Diabetes mellitus   . Hypertension   . Hyperlipidemia   . Coronary artery disease   . Cerebral embolism   . Paroxysmal atrial fibrillation   . Chronic systolic heart failure   . Stroke     Medications:  Prescriptions prior to admission  Medication Sig Dispense Refill Last Dose  . amiodarone (PACERONE) 200 MG tablet Take 1 tablet (200 mg total) by mouth daily. 30 tablet 4 01/17/2014 at Unknown time  . aspirin EC 81 MG tablet Take 81 mg by mouth daily.   01/17/2014 at Unknown time  . atorvastatin (LIPITOR) 40 MG tablet Take 40 mg by mouth at bedtime.    01/16/2014 at Unknown time  . clonazePAM (KLONOPIN) 0.5 MG tablet Take 0.5 mg by mouth at bedtime as needed. For anxiety   Past Week at Unknown time  . clotrimazole-betamethasone (LOTRISONE) cream Apply to affected area 2 times daily prn 15 g 0 Past Week at Unknown time  . colchicine 0.6 MG tablet Take 0.5 tablets (0.3 mg total) by mouth daily.   01/17/2014 at Unknown time  . Febuxostat (ULORIC) 80 MG TABS  Take 1 tablet by mouth daily.   01/17/2014 at Unknown time  . isosorbide mononitrate (IMDUR) 60 MG 24 hr tablet Take 1 tablet (60 mg total) by mouth daily. 30 tablet 6 01/17/2014 at Unknown time  . latanoprost (XALATAN) 0.005 % ophthalmic solution Place 1 drop into both eyes at bedtime.   01/16/2014 at Unknown time  . metoprolol (LOPRESSOR) 12.5 mg TABS tablet Take 1 tablet (25 mg total) by mouth 2 (two) times daily. 30 tablet 0 01/17/2014 at Unknown time  . potassium chloride (K-DUR,KLOR-CON) 10 MEQ tablet Take 1 tablet (10 mEq total) by mouth daily. 30 tablet 6 01/17/2014 at Unknown time  . spironolactone (ALDACTONE) 25 MG tablet TAKE 1 TABLET (25 MG TOTAL) BY MOUTH DAILY. 30 tablet 6 01/17/2014 at Unknown time  . torsemide (DEMADEX) 20 MG tablet Take 2 tablets (40 mg total) by mouth daily. 60 tablet 5 01/17/2014 at Unknown time  . warfarin (COUMADIN) 2.5 MG tablet Take 1.25-2.5 mg by mouth daily at 6 PM. TAKES 2.5MG  ON TUES ONLY TAKES 1.25MG  ALL OTHER DAYS   01/16/2014 at Unknown time    Assessment: 79 y/o male sent to the ED for abnormal labs results.  He also had an ED visit on 11/17 for weakness and rash. He takes chronic warfarin for Afib. INR remains therapeutic  PTA: 1.25 mg daily except 2.5 mg on Tues - last dose at home was 11/18.  INR remains therapeutic on patient's home regimen.  Goal of Therapy:  INR 2-3 Monitor platelets by anticoagulation protocol: Yes   Plan:  - Warfarin 1.25 mg PO tonight - INR daily - Monitor for s/sx of bleeding  Arlean Hoppingorey M. Newman PiesBall, PharmD Clinical Pharmacist Pager [phone]  01/19/2014 3:41 PM

## 2014-01-20 DIAGNOSIS — L89152 Pressure ulcer of sacral region, stage 2: Secondary | ICD-10-CM

## 2014-01-20 LAB — URINE CULTURE: Colony Count: >=100000

## 2014-01-20 LAB — BASIC METABOLIC PANEL
Anion gap: 18 — ABNORMAL HIGH (ref 5–15)
BUN: 55 mg/dL — ABNORMAL HIGH (ref 6–23)
CALCIUM: 8.8 mg/dL (ref 8.4–10.5)
CO2: 16 meq/L — AB (ref 19–32)
CREATININE: 2.34 mg/dL — AB (ref 0.50–1.35)
Chloride: 102 mEq/L (ref 96–112)
GFR calc Af Amer: 29 mL/min — ABNORMAL LOW (ref >90)
GFR calc non Af Amer: 25 mL/min — ABNORMAL LOW (ref >90)
Glucose, Bld: 141 mg/dL — ABNORMAL HIGH (ref 70–99)
Potassium: 4.4 mEq/L (ref 3.7–5.3)
Sodium: 136 mEq/L — ABNORMAL LOW (ref 137–147)

## 2014-01-20 LAB — GLUCOSE, CAPILLARY: Glucose-Capillary: 139 mg/dL — ABNORMAL HIGH (ref 70–99)

## 2014-01-20 LAB — PROTIME-INR
INR: 2.3 — ABNORMAL HIGH (ref 0.00–1.49)
Prothrombin Time: 25.5 seconds — ABNORMAL HIGH (ref 11.6–15.2)

## 2014-01-20 MED ORDER — CEFPODOXIME PROXETIL 100 MG PO TABS: 100.0000 mg | ORAL_TABLET | Freq: Two times a day (BID) | ORAL | Status: DC

## 2014-01-20 MED ORDER — TORSEMIDE 20 MG PO TABS: 20.0000 mg | ORAL_TABLET | Freq: Every day | ORAL | Status: AC

## 2014-01-20 NOTE — Progress Notes (Signed)
Discharge instructions and medications discussed with patient's daughter and son.  All questions answered.

## 2014-01-20 NOTE — Clinical Social Work Note (Signed)
CSW made aware by patient's RN of ambulance assistance needed home. CSW verified address and prepared d/c packet for EMS transportation. CS[facility] to arrange [place] [facility]. No further needs. CSW signing off.  Delcenia Inman Patrick-Jefferson, LCSWA Weekend Clinical Social Worker [phone]

## 2014-01-20 NOTE — Discharge Summary (Signed)
Physician Discharge Summary  [name] ZOX:[id number] DOB: [birth year] DOA: 01/17/2014  PCP: [name],[name], MD  Admit date: 01/17/2014 Discharge date: 01/20/2014  Time spent: 45 minutes  Recommendations for Outpatient Follow-up:  1. PCP in 1 week 2. Wound care of Stage 2 sacral decub ulcer 3. FYI, torsemide dose was decreased due to AKI, may need to be re-adjusted upon FU depending on volume status, weights, creatinine etc 4. Small Renal cyst like lesion noted on Renal Ultrasound needs outpatient FU/workup  Discharge Diagnoses:  Principal Problem:   Acute renal failure Active Problems:   Chronic systolic heart failure   Small hypoechoic lesion at inferior RIGHT kidney-likely small cyst   Gout   Paroxysmal atrial fibrillation   CKD (chronic kidney disease), stage III   Long term current use of anticoagulant therapy   UTI (urinary tract infection)   Acute renal failure syndrome   Decubitus ulcer of sacral region, stage 2   Debility   Bedbound status  Discharge Condition:stable  Diet recommendation: low sodium  Filed Weights   01/18/14 2058 01/19/14 2050  Weight: 108.4 kg (238 lb 15.7 oz) 108.401 kg (238 lb 15.7 oz)    History of present illness:  Dalton GravenLonnie L [place] Hill is a 79 y.o. male with past medical history of gout, diabetes mellitus, hypertension, paroxysmal atrial fibrillation, chronic systolic heart failure, CVA who is nonambulatory. The patient was brought by his family to the ER 2 days ago for generalized weakness and a rash. Was also found to be bradycardic with a heart rate less than 50. The ER doctor spoke with Dr. Katrinka BlazingSmith (cardiology) who advised cutting his metoprolol back to 12.5 mg twice a day. The patient was discharged home. Apparently the patient's PCP reviewed lab work that was done in the ER and called to his house and recommended he come back to the ER because of acute renal failure.  Hospital Course:  Acute renal failure/ CKD (chronic kidney disease),  stage III -Creatinine on 1/15 was 2.1 and on admission was 3.14 -Suspect that this is secondary to diuretics,  -improved, stopped IVF -diuretics resumed at lower dose -Renal ultrasound without hydronephrosis  Hyperkalemia -due to 1, kayexalate given 11/20 -resolved   Chronic systolic and diastolic heart failure -Echo from 2013 reveals an EF of 10-15% ,grade 3 diastolic dysfunction with moderate pulmonary hypertension and mildly reduced RV function -Repeat ECHo with improvement in EF to 25% -resume diuretics today at lower dose, torsemide 20mg  instead of 40mg    Gout -Continue colchicine-renal dose   Paroxysmal atrial fibrillation -Continue Coumadin -Continue amiodarone and metoprolol   UTI (urinary tract infection) -continue Rocephin, Urine Cx with 100K GNR   Diabetes mellitus -stable, diet controlled  -SSI   Thrombocytopenia -Mild-etiology currently uncertain-follow  Nonambulatory/Bed bound at baseline -PT eval completed, SNF recommended which patient and family declined since he is chronically debilitated at baseline and cared for by family [id number]  Small R renal cyst like lesion -needs outpatient FU   Discharge Exam: Filed Vitals:   01/20/14 1038  BP: 104/60  Pulse: 59  Temp: 97.4 F (36.3 C)  Resp: 18    General: AAOx3 Cardiovascular: S1S2/RRR Respiratory: CTAB  Discharge Instructions You were cared for by a hospitalist during your hospital stay. If you have any questions about your discharge medications or the care you received while you were in the hospital after you are discharged, you can call the unit and asked to speak with the hospitalist on call if the hospitalist that took care  of you is not available. Once you are discharged, your primary care physician will handle any further medical issues. Please note that NO REFILLS for any discharge medications will be authorized once you are discharged, as it is imperative that you return to your  primary care physician (or establish a relationship with a primary care physician if you do not have one) for your aftercare needs so that they can reassess your need for medications and monitor your lab values.  Discharge Instructions    Diet - low sodium heart healthy    Complete by:  As directed      Discharge instructions    Complete by:  As directed   Wound Care, change positions every 2hours     Increase activity slowly    Complete by:  As directed           Current Discharge Medication List    START taking these medications   Details  cefpodoxime (VANTIN) 100 MG tablet Take 1 tablet (100 mg total) by mouth 2 (two) times daily. For 4days Qty: 8 tablet, Refills: 0      CONTINUE these medications which have CHANGED   Details  torsemide (DEMADEX) 20 MG tablet Take 1 tablet (20 mg total) by mouth daily.      CONTINUE these medications which have NOT CHANGED   Details  amiodarone (PACERONE) 200 MG tablet Take 1 tablet (200 mg total) by mouth daily. Qty: 30 tablet, Refills: 4    aspirin EC 81 MG tablet Take 81 mg by mouth daily.    atorvastatin (LIPITOR) 40 MG tablet Take 40 mg by mouth at bedtime.     clonazePAM (KLONOPIN) 0.5 MG tablet Take 0.5 mg by mouth at bedtime as needed. For anxiety    clotrimazole-betamethasone (LOTRISONE) cream Apply to affected area 2 times daily prn Qty: 15 g, Refills: 0    colchicine 0.6 MG tablet Take 0.5 tablets (0.3 mg total) by mouth daily.    Febuxostat (ULORIC) 80 MG TABS Take 1 tablet by mouth daily.    isosorbide mononitrate (IMDUR) 60 MG 24 hr tablet Take 1 tablet (60 mg total) by mouth daily. Qty: 30 tablet, Refills: 6    latanoprost (XALATAN) 0.005 % ophthalmic solution Place 1 drop into both eyes at bedtime.    metoprolol (LOPRESSOR) 12.5 mg TABS tablet Take 1 tablet (25 mg total) by mouth 2 (two) times daily. Qty: 30 tablet, Refills: 0    potassium chloride (K-DUR,KLOR-CON) 10 MEQ tablet Take 1 tablet (10 mEq total) by  mouth daily. Qty: 30 tablet, Refills: 6    spironolactone (ALDACTONE) 25 MG tablet TAKE 1 TABLET (25 MG TOTAL) BY MOUTH DAILY. Qty: 30 tablet, Refills: 6    warfarin (COUMADIN) 2.5 MG tablet Take 1.25-2.5 mg by mouth daily at 6 PM. TAKES 2.5MG  ON TUES ONLY TAKES 1.25MG  ALL OTHER DAYS       Allergies  Allergen Reactions  . Other Other (See Comments)    Ear drops  I used made my throat feel funny    Follow-up Information    Follow up with SHAW,[name], MD. Schedule an appointment as soon as possible for a visit in 1 week.   Specialty:  Family Medicine   Contact information:   [address] [place] [address] [phone]        The results of significant diagnostics from this hospitalization (including imaging, microbiology, ancillary and laboratory) are listed below for reference.    Significant Diagnostic Studies:  US Renal  01/17/2014   CLINICAL DATA:  Acute renal failure  EXAM: RENAL/URINARY TRACT ULTRASOUND COMPLETE  COMPARISON:  Abdominal ultrasound 12/09/2008, MR abdomen 01/10/2009  FINDINGS: Right Kidney:  Length: 11.4 cm. Cortical thinning. Increased cortical echogenicity. Hypoechoic nodule at inferior pole 1.7 x 1.3 x 1.5 cm, containing scattered low level internal echoes. No additional mass, hydronephrosis or shadowing calcification.  Left Kidney:  Length: 11.4 cm. Limited visualization due to body habitus and limited range of motion. Cortical thinning. Increased cortical echogenicity. Upper pole inadequately visualized. Visualized portion shows no gross evidence of mass or hydronephrosis.  Bladder:  Contains minimal urine, unremarkable.  IMPRESSION: Incomplete visualization of LEFT kidney due to limitations imposed by a body habitus and limited range of motion.  Upper pole cystic lesion LEFT kidney seen on prior imaging could not be visualized on today's study due to limitations.  Medical renal disease changes of both kidneys without hydronephrosis.   Small hypoechoic lesion at inferior RIGHT kidney, minimally increased in size from prior MR, likely small cyst.   Electronically Signed   By: Ulyses Southward M.D.   On: 01/17/2014 22:14    Microbiology: Recent Results (from the past 240 hour(s))  Urine culture     Status: None   Collection Time: 01/17/14  2:51 PM  Result Value Ref Range Status   Specimen Description URINE, CLEAN CATCH  Final   Special Requests NONE  Final   Culture  Setup Time   Final    01/17/2014 22:33 Performed at [facility] Count   Final    >=100,000 COLONIES/ML Performed at [facility]    Culture   Final    PROTEUS MIRABILIS Performed at [facility]    Report Status 01/20/2014 FINAL  Final   Organism ID, Bacteria PROTEUS MIRABILIS  Final      Susceptibility   Proteus mirabilis - MIC*    AMPICILLIN >=32 RESISTANT Resistant     CEFAZOLIN 8 SENSITIVE Sensitive     CEFTRIAXONE <=1 SENSITIVE Sensitive     CIPROFLOXACIN <=0.25 SENSITIVE Sensitive     GENTAMICIN <=1 SENSITIVE Sensitive     LEVOFLOXACIN <=0.12 SENSITIVE Sensitive     NITROFURANTOIN 128 RESISTANT Resistant     TOBRAMYCIN <=1 SENSITIVE Sensitive     TRIMETH/SULFA <=20 SENSITIVE Sensitive     PIP/TAZO <=4 SENSITIVE Sensitive     * PROTEUS MIRABILIS     Labs: Basic Metabolic Panel:  Recent Labs Lab 01/17/14 1315 01/18/14 0643 01/18/14 1600 01/19/14 0550 01/20/14 0533  NA 132* 133* 132* 137 136*  K 5.2 5.9* 4.8 5.2 4.4  CL 96 98 101 104 102  CO2 20 17* 15* 18* 16*  GLUCOSE 159* 143* 178* 142* 141*  BUN 75* 72* 68* 59* 55*  CREATININE 3.04* 2.95* 2.61* 2.47* 2.34*  CALCIUM 9.8 9.4 9.0 8.9 8.8   Liver Function Tests:  Recent Labs Lab 01/17/14 1315  AST 53*  ALT 64*  ALKPHOS 108  BILITOT 0.9  PROT 8.1  ALBUMIN 3.2*   No results for input(s): LIPASE, AMYLASE in the last 168 hours. No results for input(s): AMMONIA in the last 168 hours. CBC:  Recent Labs Lab 01/15/14 1010 01/17/14 1315  01/18/14 0643  WBC 8.9 13.1* 14.7*  NEUTROABS 6.9 11.0*  --   HGB 16.7 16.4 16.1  HCT 50.1 48.4 48.3  MCV 94.4 95.5 95.8  PLT 142* 146* 145*   Cardiac Enzymes: No results for input(s): CKTOTAL, CKMB, CKMBINDEX, TROPONINI  in the last 168 hours. BNP: BNP (last 3 results) No results for input(s): PROBNP in the last 8760 hours. CBG:  Recent Labs Lab 01/19/14 0740 01/19/14 1206 01/19/14 1647 01/19/14 2054 01/20/14 0711  GLUCAP 134* 145* 178* 142* 139*       Signed:  Byrd Terrero  [facility] 01/20/2014, 10:45 AM

## 2014-01-28 ENCOUNTER — Other Ambulatory Visit: Payer: Self-pay

## 2014-01-28 MED ORDER — POTASSIUM CHLORIDE CRYS ER 10 MEQ PO TBCR: 10.0000 meq | EXTENDED_RELEASE_TABLET | Freq: Every day | ORAL | Status: DC

## 2014-01-30 NOTE — Telephone Encounter (Signed)
Spoke with [facility] pt will need to have been seen with in 90days for us to initiate another home health order  Pt wife aware that pt needs to schedule an appt with Dr.Smith so that we can initiate a home health ref.pt has not been seen since July 2015.She sts that pt is being seen today by his pcp Dr.Shaw She will have Dr.Shaw office initiate ref. Adv her to keep us updated, and to call back if further assistance is needed with getting ref

## 2014-01-31 ENCOUNTER — Telehealth: Payer: Self-pay | Admitting: Interventional Cardiology

## 2014-01-31 NOTE — Telephone Encounter (Signed)
Returned call to Vicki @ [facility] adv her that we do not have any outstanding orders to be signed, she will refax. 

## 2014-01-31 NOTE — Telephone Encounter (Signed)
New problem ° ° ° ° ° °F/u on orders that was fax 01/02/14 that need doc signature. Please advise. °

## 2014-02-08 ENCOUNTER — Ambulatory Visit (INDEPENDENT_AMBULATORY_CARE_PROVIDER_SITE_OTHER): Payer: Medicare Other | Admitting: Cardiology

## 2014-02-08 LAB — POCT INR: INR: 3

## 2014-02-15 ENCOUNTER — Ambulatory Visit (INDEPENDENT_AMBULATORY_CARE_PROVIDER_SITE_OTHER): Payer: Medicare Other | Admitting: Cardiology

## 2014-02-15 LAB — POCT INR: INR: 2.2

## 2014-02-26 NOTE — Telephone Encounter (Signed)
Called to see if pt pcp initiated a home health ref.uanable to lmom. Pt phone rings out

## 2014-02-27 ENCOUNTER — Other Ambulatory Visit: Payer: Self-pay | Admitting: Interventional Cardiology

## 2014-02-27 ENCOUNTER — Other Ambulatory Visit: Payer: Self-pay | Admitting: *Deleted

## 2014-02-27 MED ORDER — ISOSORBIDE MONONITRATE ER 60 MG PO TB24: 60.0000 mg | ORAL_TABLET | Freq: Every day | ORAL | Status: DC

## 2014-02-27 NOTE — Telephone Encounter (Signed)
attempted to call pt. unable to lmom line busy

## 2014-03-05 ENCOUNTER — Ambulatory Visit (INDEPENDENT_AMBULATORY_CARE_PROVIDER_SITE_OTHER): Payer: Medicare Other | Admitting: Interventional Cardiology

## 2014-03-05 LAB — POCT INR: INR: 2.6

## 2014-03-14 NOTE — Telephone Encounter (Signed)
Pt hs been ref to homehealth [facility] Norlander([facility]) by his pcp

## 2014-03-28 LAB — POCT INR: INR: 2.3

## 2014-03-29 ENCOUNTER — Ambulatory Visit (INDEPENDENT_AMBULATORY_CARE_PROVIDER_SITE_OTHER): Payer: Medicare Other | Admitting: Internal Medicine

## 2014-03-29 ENCOUNTER — Telehealth: Payer: Self-pay | Admitting: Interventional Cardiology

## 2014-03-29 NOTE — Telephone Encounter (Signed)
New Msg      Marcelino DusterMichelle [facility] from [facility] calling with PT/INR for pt.  PT was 27.3 and INR 2.3. Pt is on 1.25 mg of Warfarin everyday except Tuesdays. On Tuesdays pt is on 2.5mg .  Pt has been discharged and needs to have PT/INR in office from now on.  Please contact pt to [facility]   Marcelino DusterMichelle would also like to be contacted today at [phone].

## 2014-04-07 ENCOUNTER — Other Ambulatory Visit: Payer: Self-pay | Admitting: Interventional Cardiology

## 2014-04-19 ENCOUNTER — Observation Stay (HOSPITAL_COMMUNITY)
Admission: EM | Admit: 2014-04-19 | Discharge: 2014-04-20 | Disposition: A | Discharge status: Home / Self Care | Payer: Medicare Other | Attending: Internal Medicine | Admitting: Internal Medicine

## 2014-04-19 ENCOUNTER — Encounter (HOSPITAL_COMMUNITY): Payer: Self-pay | Admitting: *Deleted

## 2014-04-19 ENCOUNTER — Observation Stay (HOSPITAL_COMMUNITY): Payer: Medicare Other

## 2014-04-19 ENCOUNTER — Emergency Department (HOSPITAL_COMMUNITY): Payer: Medicare Other

## 2014-04-19 DIAGNOSIS — R0789 Other chest pain: Secondary | ICD-10-CM

## 2014-04-19 DIAGNOSIS — I10 Essential (primary) hypertension: Secondary | ICD-10-CM | POA: Diagnosis not present

## 2014-04-19 DIAGNOSIS — R079 Chest pain, unspecified: Secondary | ICD-10-CM | POA: Diagnosis present

## 2014-04-19 DIAGNOSIS — I480 Paroxysmal atrial fibrillation: Secondary | ICD-10-CM | POA: Diagnosis not present

## 2014-04-19 DIAGNOSIS — E119 Type 2 diabetes mellitus without complications: Secondary | ICD-10-CM | POA: Diagnosis not present

## 2014-04-19 DIAGNOSIS — M109 Gout, unspecified: Secondary | ICD-10-CM | POA: Insufficient documentation

## 2014-04-19 DIAGNOSIS — Z79899 Other long term (current) drug therapy: Secondary | ICD-10-CM | POA: Insufficient documentation

## 2014-04-19 DIAGNOSIS — Z951 Presence of aortocoronary bypass graft: Secondary | ICD-10-CM | POA: Diagnosis not present

## 2014-04-19 DIAGNOSIS — N184 Chronic kidney disease, stage 4 (severe): Secondary | ICD-10-CM

## 2014-04-19 DIAGNOSIS — Z87891 Personal history of nicotine dependence: Secondary | ICD-10-CM | POA: Diagnosis not present

## 2014-04-19 DIAGNOSIS — Z7901 Long term (current) use of anticoagulants: Secondary | ICD-10-CM | POA: Insufficient documentation

## 2014-04-19 DIAGNOSIS — Z8673 Personal history of transient ischemic attack (TIA), and cerebral infarction without residual deficits: Secondary | ICD-10-CM | POA: Insufficient documentation

## 2014-04-19 DIAGNOSIS — Z7952 Long term (current) use of systemic steroids: Secondary | ICD-10-CM | POA: Diagnosis not present

## 2014-04-19 DIAGNOSIS — Z86718 Personal history of other venous thrombosis and embolism: Secondary | ICD-10-CM | POA: Diagnosis not present

## 2014-04-19 DIAGNOSIS — Z7982 Long term (current) use of aspirin: Secondary | ICD-10-CM | POA: Insufficient documentation

## 2014-04-19 DIAGNOSIS — E785 Hyperlipidemia, unspecified: Secondary | ICD-10-CM | POA: Diagnosis not present

## 2014-04-19 DIAGNOSIS — I5022 Chronic systolic (congestive) heart failure: Secondary | ICD-10-CM | POA: Diagnosis not present

## 2014-04-19 DIAGNOSIS — R0602 Shortness of breath: Secondary | ICD-10-CM | POA: Diagnosis present

## 2014-04-19 DIAGNOSIS — I5042 Chronic combined systolic (congestive) and diastolic (congestive) heart failure: Secondary | ICD-10-CM

## 2014-04-19 DIAGNOSIS — R109 Unspecified abdominal pain: Principal | ICD-10-CM | POA: Insufficient documentation

## 2014-04-19 DIAGNOSIS — E875 Hyperkalemia: Secondary | ICD-10-CM

## 2014-04-19 LAB — BASIC METABOLIC PANEL
ANION GAP: 8 (ref 5–15)
BUN: 28 mg/dL — ABNORMAL HIGH (ref 6–23)
CALCIUM: 8.7 mg/dL (ref 8.4–10.5)
CO2: 23 mmol/L (ref 19–32)
Chloride: 103 mmol/L (ref 96–112)
Creatinine, Ser: 2.62 mg/dL — ABNORMAL HIGH (ref 0.50–1.35)
GFR calc Af Amer: 25 mL/min — ABNORMAL LOW (ref >90)
GFR, EST NON AFRICAN AMERICAN: 22 mL/min — AB (ref >90)
Glucose, Bld: 151 mg/dL — ABNORMAL HIGH (ref 70–99)
Potassium: 6.7 mmol/L (ref 3.5–5.1)
Sodium: 134 mmol/L — ABNORMAL LOW (ref 135–145)

## 2014-04-19 LAB — BRAIN NATRIURETIC PEPTIDE: B NATRIURETIC PEPTIDE 5: 235.8 pg/mL — AB (ref 0.0–100.0)

## 2014-04-19 LAB — CBC
HCT: 42.3 % (ref 39.0–52.0)
Hemoglobin: 14 g/dL (ref 13.0–17.0)
MCH: 31.9 pg (ref 26.0–34.0)
MCHC: 33.1 g/dL (ref 30.0–36.0)
MCV: 96.4 fL (ref 78.0–100.0)
Platelets: 148 10*3/uL — ABNORMAL LOW (ref 150–400)
RBC: 4.39 MIL/uL (ref 4.22–5.81)
RDW: 15.2 % (ref 11.5–15.5)
WBC: 6.6 10*3/uL (ref 4.0–10.5)

## 2014-04-19 LAB — I-STAT TROPONIN, ED: Troponin i, poc: 0.02 ng/mL (ref 0.00–0.08)

## 2014-04-19 LAB — CK: CK TOTAL: 31 U/L (ref 7–232)

## 2014-04-19 LAB — PROTIME-INR
INR: 1.57 — AB (ref 0.00–1.49)
PROTHROMBIN TIME: 18.9 s — AB (ref 11.6–15.2)

## 2014-04-19 LAB — POTASSIUM: Potassium: 4.4 mmol/L (ref 3.5–5.1)

## 2014-04-19 LAB — TROPONIN I: TROPONIN I: 0.03 ng/mL (ref <0.031)

## 2014-04-19 LAB — GLUCOSE, CAPILLARY: Glucose-Capillary: 168 mg/dL — ABNORMAL HIGH (ref 70–99)

## 2014-04-19 MED ORDER — ACETAMINOPHEN 650 MG RE SUPP: 650.0000 mg | Freq: Four times a day (QID) | RECTAL | Status: DC | PRN

## 2014-04-19 MED ORDER — DEXTROSE 50 % IV SOLN
1.0000 | Freq: Once | INTRAVENOUS | Status: DC
  Filled 2014-04-19: qty 50

## 2014-04-19 MED ORDER — SODIUM POLYSTYRENE SULFONATE 15 GM/60ML PO SUSP
30.0000 g | Freq: Once | ORAL | Status: DC
Start: 2014-04-19 — End: 2014-04-20

## 2014-04-19 MED ORDER — CLONAZEPAM 0.5 MG PO TABS
0.5000 mg | ORAL_TABLET | Freq: Every evening | ORAL | Status: DC | PRN
  Administered 2014-04-19: 0.5 mg via ORAL
  Filled 2014-04-19: qty 1

## 2014-04-19 MED ORDER — ACETAMINOPHEN 325 MG PO TABS
650.0000 mg | ORAL_TABLET | Freq: Four times a day (QID) | ORAL | Status: DC | PRN
Start: 2014-04-19 — End: 2014-04-20

## 2014-04-19 MED ORDER — ALBUTEROL SULFATE (2.5 MG/3ML) 0.083% IN NEBU
10.0000 mg | INHALATION_SOLUTION | Freq: Once | RESPIRATORY_TRACT | Status: DC
  Filled 2014-04-19: qty 12

## 2014-04-19 MED ORDER — ISOSORBIDE MONONITRATE ER 60 MG PO TB24
60.0000 mg | ORAL_TABLET | Freq: Every day | ORAL | Status: DC
  Administered 2014-04-20: 60 mg via ORAL
  Filled 2014-04-19: qty 1

## 2014-04-19 MED ORDER — LATANOPROST 0.005 % OP SOLN
1.0000 [drp] | Freq: Every day | OPHTHALMIC | Status: DC
  Administered 2014-04-19: 1 [drp] via OPHTHALMIC
  Filled 2014-04-19: qty 2.5

## 2014-04-19 MED ORDER — INSULIN ASPART 100 UNIT/ML ~~LOC~~ SOLN
0.0000 [IU] | Freq: Three times a day (TID) | SUBCUTANEOUS | Status: DC
  Administered 2014-04-20 (×2): 1 [IU] via SUBCUTANEOUS

## 2014-04-19 MED ORDER — AMIODARONE HCL 200 MG PO TABS
200.0000 mg | ORAL_TABLET | Freq: Every day | ORAL | Status: DC
  Administered 2014-04-20: 200 mg via ORAL
  Filled 2014-04-19: qty 1

## 2014-04-19 MED ORDER — FEBUXOSTAT 40 MG PO TABS
80.0000 mg | ORAL_TABLET | Freq: Every day | ORAL | Status: DC
  Administered 2014-04-20: 80 mg via ORAL
  Filled 2014-04-19 (×2): qty 2

## 2014-04-19 MED ORDER — ONDANSETRON HCL 4 MG/2ML IJ SOLN: 4.0000 mg | Freq: Four times a day (QID) | INTRAMUSCULAR | Status: DC | PRN

## 2014-04-19 MED ORDER — SODIUM CHLORIDE 0.9 % IJ SOLN
3.0000 mL | Freq: Two times a day (BID) | INTRAMUSCULAR | Status: DC
  Administered 2014-04-19 – 2014-04-20 (×2): 3 mL via INTRAVENOUS

## 2014-04-19 MED ORDER — ASPIRIN EC 81 MG PO TBEC
81.0000 mg | DELAYED_RELEASE_TABLET | Freq: Every day | ORAL | Status: DC
  Administered 2014-04-20: 81 mg via ORAL
  Filled 2014-04-19: qty 1

## 2014-04-19 MED ORDER — WARFARIN - PHARMACIST DOSING INPATIENT: Freq: Every day | Status: DC

## 2014-04-19 MED ORDER — INSULIN ASPART 100 UNIT/ML IV SOLN
10.0000 [IU] | Freq: Once | INTRAVENOUS | Status: DC
  Filled 2014-04-19: qty 1

## 2014-04-19 MED ORDER — CALCIUM GLUCONATE 10 % IV SOLN
1.0000 g | Freq: Once | INTRAVENOUS | Status: DC
  Filled 2014-04-19: qty 10

## 2014-04-19 MED ORDER — SODIUM POLYSTYRENE SULFONATE 15 GM/60ML PO SUSP
30.0000 g | Freq: Once | ORAL | Status: DC
  Filled 2014-04-19: qty 120

## 2014-04-19 MED ORDER — SODIUM CHLORIDE 0.9 % IV SOLN
INTRAVENOUS | Status: DC
  Administered 2014-04-19: 22:00:00 via INTRAVENOUS

## 2014-04-19 MED ORDER — ATORVASTATIN CALCIUM 40 MG PO TABS
40.0000 mg | ORAL_TABLET | Freq: Every day | ORAL | Status: DC
  Administered 2014-04-19: 40 mg via ORAL
  Filled 2014-04-19: qty 1

## 2014-04-19 MED ORDER — ONDANSETRON HCL 4 MG PO TABS: 4.0000 mg | ORAL_TABLET | Freq: Four times a day (QID) | ORAL | Status: DC | PRN

## 2014-04-19 MED ORDER — ALBUTEROL (5 MG/ML) CONTINUOUS INHALATION SOLN
10.0000 mg/h | INHALATION_SOLUTION | RESPIRATORY_TRACT | Status: DC
  Administered 2014-04-19: 10 mg/h via RESPIRATORY_TRACT
  Filled 2014-04-19: qty 20

## 2014-04-19 MED ORDER — WARFARIN SODIUM 2.5 MG PO TABS
2.5000 mg | ORAL_TABLET | Freq: Once | ORAL | Status: AC
  Administered 2014-04-19: 2.5 mg via ORAL
  Filled 2014-04-19: qty 1

## 2014-04-19 MED ORDER — INSULIN GLARGINE 100 UNIT/ML ~~LOC~~ SOLN
4.0000 [IU] | Freq: Every day | SUBCUTANEOUS | Status: DC
  Administered 2014-04-19: 4 [IU] via SUBCUTANEOUS
  Filled 2014-04-19 (×2): qty 0.04

## 2014-04-19 MED ORDER — BRIMONIDINE TARTRATE 0.2 % OP SOLN
1.0000 [drp] | Freq: Two times a day (BID) | OPHTHALMIC | Status: DC
  Administered 2014-04-19 – 2014-04-20 (×2): 1 [drp] via OPHTHALMIC
  Filled 2014-04-19: qty 5

## 2014-04-19 MED ORDER — METOPROLOL TARTRATE 12.5 MG HALF TABLET
12.5000 mg | ORAL_TABLET | Freq: Two times a day (BID) | ORAL | Status: DC
  Administered 2014-04-19 – 2014-04-20 (×2): 12.5 mg via ORAL
  Filled 2014-04-19 (×2): qty 1

## 2014-04-19 NOTE — ED Notes (Signed)
Admitting MD at bedside.

## 2014-04-19 NOTE — ED Notes (Signed)
Patient has booties to both feet.  Patient with noted hx of vascular surgery to bil legs.

## 2014-04-19 NOTE — Progress Notes (Signed)
ANTICOAGULATION CONSULT NOTE - Initial Consult  Pharmacy Consult for coumadin Indication: atrial fibrillation  Allergies  Allergen Reactions  . Other Other (See Comments)    Ear drops  I used made my throat feel funny     Vital Signs: Temp: 98.2 F (36.8 C) (02/19 1826) Temp Source: Oral (02/19 1826) BP: 105/58 mmHg (02/19 1924) Pulse Rate: 60 (02/19 1924)  Labs:  Recent Labs  04/19/14 1434 04/19/14 1609  HGB 14.0  --   HCT 42.3  --   PLT 148*  --   LABPROT  --  18.9*  INR  --  1.57*  CREATININE 2.62*  --     CrCl cannot be calculated (Unknown ideal weight.).   Medical History: Past Medical History  Diagnosis Date  . Gout   . Diabetes mellitus   . Hypertension   . Hyperlipidemia   . Coronary artery disease   . Cerebral embolism   . Paroxysmal atrial fibrillation   . Chronic systolic heart failure   . Stroke     Medications:  Prescriptions prior to admission  Medication Sig Dispense Refill Last Dose  . amiodarone (PACERONE) 200 MG tablet TAKE 1 TABLET (200 MG TOTAL) BY MOUTH DAILY. 30 tablet 4 04/19/2014 at Unknown time  . aspirin EC 81 MG tablet Take 81 mg by mouth daily.   04/19/2014 at Unknown time  . atorvastatin (LIPITOR) 40 MG tablet Take 40 mg by mouth at bedtime.    04/18/2014 at Unknown time  . clonazePAM (KLONOPIN) 0.5 MG tablet Take 0.5 mg by mouth at bedtime as needed. For anxiety   04/18/2014 at Unknown time  . clotrimazole-betamethasone (LOTRISONE) cream Apply to affected area 2 times daily prn 15 g 0 Past Week at Unknown time  . colchicine 0.6 MG tablet Take 0.5 tablets (0.3 mg total) by mouth daily.   04/18/2014 at Unknown time  . DiphenhydrAMINE HCl 50 MG/30ML LIQD Take 25 mg by mouth at bedtime as needed (for sleep).   Past Week at Unknown time  . Febuxostat (ULORIC) 80 MG TABS Take 1 tablet by mouth daily.   04/18/2014 at Unknown time  . isosorbide mononitrate (IMDUR) 60 MG 24 hr tablet Take 1 tablet (60 mg total) by mouth daily. 30 tablet 1  04/18/2014 at Unknown time  . latanoprost (XALATAN) 0.005 % ophthalmic solution Place 1 drop into both eyes at bedtime.   04/18/2014 at Unknown time  . metoprolol (LOPRESSOR) 12.5 mg TABS tablet Take 1 tablet (25 mg total) by mouth 2 (two) times daily. (Patient taking differently: Take 12.5 mg by mouth 2 (two) times daily. ) 30 tablet 0 04/19/2014 at 0900  . potassium chloride (K-DUR,KLOR-CON) 10 MEQ tablet Take 1 tablet (10 mEq total) by mouth daily. 30 tablet 3 04/18/2014 at Unknown time  . spironolactone (ALDACTONE) 25 MG tablet TAKE 1 TABLET (25 MG TOTAL) BY MOUTH DAILY. 30 tablet 6 04/18/2014 at Unknown time  . torsemide (DEMADEX) 20 MG tablet Take 1 tablet (20 mg total) by mouth daily.   04/18/2014 at Unknown time  . warfarin (COUMADIN) 2.5 MG tablet Take 1.25-2.5 mg by mouth daily at 6 PM. TAKES 2.5MG  ON TUES ONLY TAKES 1.25MG  ALL OTHER DAYS   04/18/2014 at Unknown time   Scheduled:  . amiodarone  200 mg Oral Daily  . aspirin EC  81 mg Oral Daily  . atorvastatin  40 mg Oral QHS  . calcium gluconate  1 g Intravenous Once  . clonazePAM  0.5 mg Oral QHS  .  dextrose  1 ampule Intravenous Once  . Febuxostat  1 tablet Oral Daily  . [START ON 04/20/2014] insulin aspart  0-9 Units Subcutaneous TID WC  . insulin aspart  10 Units Intravenous Once  . insulin glargine  4 Units Subcutaneous QHS  . isosorbide mononitrate  60 mg Oral Daily  . latanoprost  1 drop Both Eyes QHS  . metoprolol  12.5 mg Oral BID  . sodium chloride  3 mL Intravenous Q12H  . sodium polystyrene  30 g Oral Once  . sodium polystyrene  30 g Oral Once    Assessment: 80 yo male her with CP. He is on coumadin PTA for afib and pharmacy has been consulted to dose.  INR today= 1.57 and below goal.  He was noted at goal at his last coumadin clinic visit on 03/29/14.   Home coumadin regimen: 1.25mg /day except takes 2.5mg  on Tuesday (last dose 04/18/14)  Goal of Therapy:  INR 2-3 Monitor platelets by anticoagulation protocol: Yes    Plan:  -Coumadin 2.5mg  po today -Daily PT/INR  Harland German, Pharm D 04/19/2014 8:14 PM

## 2014-04-19 NOTE — ED Provider Notes (Signed)
CSN: [id number]     Arrival date & time 04/19/14  1404 History   First MD Initiated Contact with Patient 04/19/14 1500     Chief Complaint  Patient presents with  . Palpitations  . Chest Pain  . Shortness of Breath     (Consider location/radiation/quality/duration/timing/severity/associated sxs/prior Treatment) Patient is a 80 y.o. male presenting with palpitations, chest pain, and shortness of breath. The history is provided by the patient.  Palpitations Palpitations quality:  Fast Onset quality:  Sudden Timing:  Sporadic Progression:  Resolved Chronicity:  New Associated symptoms: chest pain   Associated symptoms: no cough, no diaphoresis, no shortness of breath and no vomiting   Chest pain:    Quality: pressure     Severity:  Moderate   Onset quality:  Sudden   Duration: several minutes.   Timing:  Constant   Progression:  Unchanged   Chronicity:  New Risk factors: no diabetes mellitus, no heart disease and no hx of thyroid disease   Chest Pain Associated symptoms: palpitations   Associated symptoms: no abdominal pain, no cough, no diaphoresis, no fever, no shortness of breath and not vomiting   Shortness of Breath Associated symptoms: chest pain   Associated symptoms: no abdominal pain, no cough, no diaphoresis, no fever and no vomiting     Past Medical History  Diagnosis Date  . Gout   . Diabetes mellitus   . Hypertension   . Hyperlipidemia   . Coronary artery disease   . Cerebral embolism   . Paroxysmal atrial fibrillation   . Chronic systolic heart failure   . Stroke    Past Surgical History  Procedure Laterality Date  . Coronary artery bypass graft    . Cholecystectomy     Family History  Problem Relation Age of Onset  . Coronary artery disease Maternal Aunt    History  Substance Use Topics  . Smoking status: Former Smoker -- 1.00 packs/day for 25 years    Types: Cigarettes    Quit date: 03/01/1968  . Smokeless tobacco: Not on file  . Alcohol  Use: No    Review of Systems  Constitutional: Negative for fever, chills and diaphoresis.  Respiratory: Negative for cough and shortness of breath.   Cardiovascular: Positive for chest pain and palpitations.  Gastrointestinal: Negative for vomiting and abdominal pain.  All other systems reviewed and are negative.     Allergies  Other  Home Medications   Prior to Admission medications   Medication Sig Start Date End Date Taking? Authorizing Provider  amiodarone (PACERONE) 200 MG tablet TAKE 1 TABLET (200 MG TOTAL) BY MOUTH DAILY. 04/09/14   Lyn Records III, MD  aspirin EC 81 MG tablet Take 81 mg by mouth daily.    Historical Provider, MD  atorvastatin (LIPITOR) 40 MG tablet Take 40 mg by mouth at bedtime.     Historical Provider, MD  cefpodoxime (VANTIN) 100 MG tablet Take 1 tablet (100 mg total) by mouth 2 (two) times daily. For 4days 01/20/14   Zannie Cove, MD  clonazePAM (KLONOPIN) 0.5 MG tablet Take 0.5 mg by mouth at bedtime as needed. For anxiety    Historical Provider, MD  clotrimazole-betamethasone (LOTRISONE) cream Apply to affected area 2 times daily prn 01/15/14   Mellody Drown, PA-C  colchicine 0.6 MG tablet Take 0.5 tablets (0.3 mg total) by mouth daily. 08/25/11   Elease Etienne, MD  Febuxostat (ULORIC) 80 MG TABS Take 1 tablet by mouth daily.    Historical  Provider, MD  isosorbide mononitrate (IMDUR) 60 MG 24 hr tablet Take 1 tablet (60 mg total) by mouth daily. 02/27/14   Lyn RecordsHenry W Smith III, MD  latanoprost (XALATAN) 0.005 % ophthalmic solution Place 1 drop into both eyes at bedtime.    Historical Provider, MD  metoprolol (LOPRESSOR) 12.5 mg TABS tablet Take 1 tablet (25 mg total) by mouth 2 (two) times daily. 01/15/14   Mellody DrownLauren Parker, PA-C  potassium chloride (K-DUR,KLOR-CON) 10 MEQ tablet Take 1 tablet (10 mEq total) by mouth daily. 01/28/14   Lyn RecordsHenry W Smith III, MD  spironolactone (ALDACTONE) 25 MG tablet TAKE 1 TABLET (25 MG TOTAL) BY MOUTH DAILY. 09/24/13   Lyn RecordsHenry  W Smith III, MD  torsemide (DEMADEX) 20 MG tablet Take 1 tablet (20 mg total) by mouth daily. 01/20/14   Zannie CovePreetha [name], MD  warfarin (COUMADIN) 2.5 MG tablet Take 1.25-2.5 mg by mouth daily at 6 PM. TAKES 2.5MG  ON TUES ONLY TAKES 1.25MG  ALL OTHER DAYS    Historical Provider, MD   BP 106/65 mmHg  Pulse 56  Temp(Src) 97.6 F (36.4 C) (Oral)  Resp 14  SpO2 99% Physical Exam  Constitutional: He is oriented to person, place, and time. He appears well-developed and well-nourished. No distress.  HENT:  Head: Normocephalic and atraumatic.  Mouth/Throat: Oropharynx is clear and moist. No oropharyngeal exudate.  Eyes: EOM are normal. Pupils are equal, round, and reactive to light.  Neck: Normal range of motion. Neck supple.  Cardiovascular: Normal rate and regular rhythm.  Exam reveals no friction rub.   No murmur heard. Pulmonary/Chest: Effort normal and breath sounds normal. No respiratory distress. He has no wheezes. He has no rales.  Abdominal: Soft. He exhibits no distension. There is no tenderness. There is no rebound.  Musculoskeletal: Normal range of motion. He exhibits no edema.  Neurological: He is alert and oriented to person, place, and time. No cranial nerve deficit. He exhibits normal muscle tone. Coordination normal.  Skin: No rash noted. He is not diaphoretic.  Nursing note and vitals reviewed.   ED Course  Procedures (including critical care time) Labs Review Labs Reviewed  CBC - Abnormal; Notable for the following:    Platelets 148 (*)    All other components within normal limits  BASIC METABOLIC PANEL - Abnormal; Notable for the following:    Sodium 134 (*)    Potassium 6.7 (*)    Glucose, Bld 151 (*)    BUN 28 (*)    Creatinine, Ser 2.62 (*)    GFR calc non Af Amer 22 (*)    GFR calc Af Amer 25 (*)    All other components within normal limits  BRAIN NATRIURETIC PEPTIDE  I-STAT TROPOININ, ED    Imaging Review Dg Chest Port 1 View  04/19/2014   CLINICAL  DATA:  Shortness of breath, chest pain and palpitations.  EXAM: PORTABLE CHEST - 1 VIEW  COMPARISON:  07/02/2012  FINDINGS: The heart size is stable and mildly enlarged status post prior CABG. Lung volumes are low with bibasilar atelectasis present. There is no evidence of pulmonary edema, consolidation, pneumothorax, nodule or pleural fluid.  IMPRESSION: Stable cardiomegaly. Low volumes with bibasilar atelectasis. No active disease.   Electronically Signed   By: Irish LackGlenn  Yamagata M.D.   On: 04/19/2014 15:09     EKG Interpretation   Date/Time:  Friday April 19 2014 14:14:36 EST Ventricular Rate:  57 PR Interval:  215 QRS Duration: 122 QT Interval:  495 QTC Calculation: 482 R  Axis:   17 Text Interpretation:  Sinus rhythm Borderline prolonged PR interval Left  bundle branch block Similar to prior Confirmed by Gwendolyn Grant  MD, Ryelynn Guedea ([id number])  on 04/19/2014 3:01:27 PM      MDM   Final diagnoses:  None  Chest pain  75M here with CP. Began at home while at rest. Had a few minutes of central pressure that did not radiate. Self limiting. Hx of DM, HTN, CAD s/p CABG, CHF.  Here exam benign. EKG ok. Plan for labs, CXR, observation for his CP due to his multitude of risk factors. Labs came back with K of 6.7. Hx of CKD and one prior elevated potassium, but normally potassium is ok. Will treat with calcium, albuterol, insulin/glucose, kayexalate. Admitted.   Elwin Mocha, MD 04/20/14 [phone]

## 2014-04-19 NOTE — ED Notes (Signed)
Patient with condom cath in place.  He is incontinent of urine

## 2014-04-19 NOTE — ED Notes (Signed)
Patient resides at home with family.  He is bed bound.  Patient with reported sob and chest pain.  Patient also reported to have palpitations.  Patient denies complaints upon arrival of EMS.  He has dementia.  He has cardiac hx.  Patient noted to be saturated in urine upon arrival.  Patient cbg reported 158.   ekg afib, rate in 60;s

## 2014-04-19 NOTE — ED Notes (Signed)
Transporting patient to new room assignment. 

## 2014-04-19 NOTE — H&P (Signed)
History and Physical  [name] ZOX:[id number] DOB: Apr 07, 1934 DOA: 04/19/2014   PCP: Lupita Raider, MD   Chief Complaint: chest pain  HPI:  80 year old male with a history of coronary artery disease/CABG 14 years ago, diabetes mellitus, hypertension, paroxysmal atrial fibrillation, chronic systolic CHF, and CKD stage IV presents with 2-3 day history of chest pain and shortness of breath. The patient states that the sensation is substernal without any radiation without any exacerbating or alleviating factors. The patient is a poor historian. Much of this history is obtained from speaking with the patient's daughter at the bedside. When asked why he is in the hospital, the patient states that "I'm in bed shape".  The patient has not had any nausea, vomiting, diarrhea, abdominal pain, dysuria, hematuria. He complains of constipation without any hematochezia or melena. The patient's daughter helps him with his medications on a daily basis. Presently, the patient is pain free. At home, the patient is normally wheelchair bound and requires significant assistance for transfers.  In the emergency department, the patient was noted to be hyperkalemic with potassium of 6.7. Albuterol, insulin, D50 were ordered. Was Ordered but Had Not yet Been Given. Serum Creatinine Was Noted to Be 2.62. Sodium 134. WBC 6.6, hemoglobin 14.0, platelets 148,000. BNP was 235. The patient was afebrile with oxygen saturation 100% on room air. EKG Afib showed left bundle branch block with nonspecific ST changes. Chest x-ray was negative for any infiltrate or pulmonary edema  Assessment/Plan: Atypical chest pain -As the patient has a history of coronary artery disease with CABG, admit the patient under observation  -Cycle troponins  -EKG with upper branch block, nonspecific ST changes  Atrial fibrillation  -Appears chronic  -Continue warfarin  -Continue metoprolol tartrate  -Continue amiodarone -TSH Chronic  systolic and diastolic CHF  -Appears compensated  -01/18/2014 echocardiogram EF 25-30 percent, grade 1 diastolic dysfunction  -daily wt Hyperkalemia  -Likely due to a combination of the patient's potassium supplementation in the setting of spironolactone use discontinue spironolactone and potassium supplementation -Kayexalate -insulin, D50 and albulterol and calcium gluconate given in ED CKD stage IV  -Baseline creatinine 2.3-2.6  -Give 500 mL normal saline  -Discontinue Aldactone  -Hold torsemide for today and re-evaluate in am -check CK -hold colchicine Diabetes mellitus type 2  -Patient normally takes 8 units Lantus at bedtime  -give half home dose-->lantus 4 units -novolog sliding scale -A1C Abdominal pain -likely due to constipation -kayexalate should help -2 view abd -start cathartics if no good BM     Past Medical History  Diagnosis Date  . Gout   . Diabetes mellitus   . Hypertension   . Hyperlipidemia   . Coronary artery disease   . Cerebral embolism   . Paroxysmal atrial fibrillation   . Chronic systolic heart failure   . Stroke    Past Surgical History  Procedure Laterality Date  . Coronary artery bypass graft    . Cholecystectomy     Social History:  reports that he quit smoking about 46 years ago. His smoking use included Cigarettes. He has a 25 pack-year smoking history. He does not have any smokeless tobacco history on file. He reports that he does not drink alcohol. His drug history is not on file.   Family History  Problem Relation Age of Onset  . Coronary artery disease Maternal Aunt      Allergies  Allergen Reactions  . Other Other (See Comments)    Ear drops  I used made my throat feel funny       Prior to Admission medications   Medication Sig Start Date End Date Taking? Authorizing Provider  amiodarone (PACERONE) 200 MG tablet TAKE 1 TABLET (200 MG TOTAL) BY MOUTH DAILY. 04/09/14   Lyn Records III, MD  aspirin EC 81 MG tablet Take  81 mg by mouth daily.    Historical Provider, MD  atorvastatin (LIPITOR) 40 MG tablet Take 40 mg by mouth at bedtime.     Historical Provider, MD  cefpodoxime (VANTIN) 100 MG tablet Take 1 tablet (100 mg total) by mouth 2 (two) times daily. For 4days 01/20/14   Zannie Cove, MD  clonazePAM (KLONOPIN) 0.5 MG tablet Take 0.5 mg by mouth at bedtime as needed. For anxiety    Historical Provider, MD  clotrimazole-betamethasone (LOTRISONE) cream Apply to affected area 2 times daily prn 01/15/14   Mellody Drown, PA-C  colchicine 0.6 MG tablet Take 0.5 tablets (0.3 mg total) by mouth daily. 08/25/11   Elease Etienne, MD  Febuxostat (ULORIC) 80 MG TABS Take 1 tablet by mouth daily.    Historical Provider, MD  isosorbide mononitrate (IMDUR) 60 MG 24 hr tablet Take 1 tablet (60 mg total) by mouth daily. 02/27/14   Lyn Records III, MD  latanoprost (XALATAN) 0.005 % ophthalmic solution Place 1 drop into both eyes at bedtime.    Historical Provider, MD  metoprolol (LOPRESSOR) 12.5 mg TABS tablet Take 1 tablet (25 mg total) by mouth 2 (two) times daily. 01/15/14   Mellody Drown, PA-C  potassium chloride (K-DUR,KLOR-CON) 10 MEQ tablet Take 1 tablet (10 mEq total) by mouth daily. 01/28/14   Lyn Records III, MD  spironolactone (ALDACTONE) 25 MG tablet TAKE 1 TABLET (25 MG TOTAL) BY MOUTH DAILY. 09/24/13   Lyn Records III, MD  torsemide (DEMADEX) 20 MG tablet Take 1 tablet (20 mg total) by mouth daily. 01/20/14   Zannie Cove, MD  warfarin (COUMADIN) 2.5 MG tablet Take 1.25-2.5 mg by mouth daily at 6 PM. TAKES 2.5MG  ON TUES ONLY TAKES 1.25MG  ALL OTHER DAYS    Historical Provider, MD    Review of Systems:  Constitutional:  No weight loss, night sweats, Fevers, chills, fatigue.  Head&Eyes: No headache.  No vision changes.  Pt is blind ENT:  No Difficulty swallowing,Tooth/dental problems,Sore throat,   Cardio-vascular:  No Orthopnea, PND, swelling in lower extremities,  dizziness, palpitations  GI:    No  abdominal pain, nausea, vomiting, diarrhea, loss of appetite, hematochezia, melena, heartburn, indigestion, Resp:  . No cough. No coughing up of blood .No wheezing.No chest wall deformity  Skin:  no rash or lesions.  GU:  no dysuria, change in color of urine, no urgency or frequency. No flank pain.  Musculoskeletal:  No joint pain or swelling. No decreased range of motion. No back pain.  Psych:  No change in mood or affect. Neurologic: No headache, no dysesthesia, no focal weakness, no vision loss. No syncope  Physical Exam: Filed Vitals:   04/19/14 1500 04/19/14 1530 04/19/14 1554 04/19/14 1600  BP: 106/65 95/67 85/51  98/83  Pulse: 56 53  56  Temp:      TempSrc:      Resp: SpO2: 99% 99% 100% 100%   General:  A&O x 3, NAD, nontoxic, pleasant/cooperative Head/Eye: No conjunctival hemorrhage, no icterus, [place]/AT, No nystagmus ENT:  No icterus,  No thrush, edentulous, no pharyngeal exudate Neck:  No masses, no lymphadenpathy,  no bruits CV:  IRRR, no rub, no gallop, no S3 Lung:  Bibasilar crackles. No wheeze. Good air movement Abdomen: soft/NT, +BS, nondistended, no peritoneal signs Ext: No cyanosis, No rashes, No petechiae, No lymphangitis, No edema   Labs on Admission:  Basic Metabolic Panel:  Recent Labs Lab 04/19/14 1434  NA 134*  K 6.7*  CL 103  CO2 23  GLUCOSE 151*  BUN 28*  CREATININE 2.62*  CALCIUM 8.7   Liver Function Tests: No results for input(s): AST, ALT, ALKPHOS, BILITOT, PROT, ALBUMIN in the last 168 hours. No results for input(s): LIPASE, AMYLASE in the last 168 hours. No results for input(s): AMMONIA in the last 168 hours. CBC:  Recent Labs Lab 04/19/14 1434  WBC 6.6  HGB 14.0  HCT 42.3  MCV 96.4  PLT 148*   Cardiac Enzymes: No results for input(s): CKTOTAL, CKMB, CKMBINDEX, TROPONINI in the last 168 hours. BNP: Invalid input(s): POCBNP CBG: No results for input(s): GLUCAP in the last 168 hours.  Radiological Exams  on Admission: Dg Chest Port 1 View  04/19/2014   CLINICAL DATA:  Shortness of breath, chest pain and palpitations.  EXAM: PORTABLE CHEST - 1 VIEW  COMPARISON:  07/02/2012  FINDINGS: The heart size is stable and mildly enlarged status post prior CABG. Lung volumes are low with bibasilar atelectasis present. There is no evidence of pulmonary edema, consolidation, pneumothorax, nodule or pleural fluid.  IMPRESSION: Stable cardiomegaly. Low volumes with bibasilar atelectasis. No active disease.   Electronically Signed   By: Irish LackGlenn  Yamagata M.D.   On: 04/19/2014 15:09    EKG: Independently reviewed. Afib, LBBB, nonspecific ST changes    Time spent:60 minutes Code Status:   FULL Family Communication:   Son and daughter updated at bedside   Arianni Gallego, DO  [facility] Pager [phone]  If 7PM-7AM, please contact night-coverage [email or web address] Password [email or web address] 04/19/2014, 4:35 PM

## 2014-04-20 DIAGNOSIS — N184 Chronic kidney disease, stage 4 (severe): Secondary | ICD-10-CM

## 2014-04-20 DIAGNOSIS — I5042 Chronic combined systolic (congestive) and diastolic (congestive) heart failure: Secondary | ICD-10-CM

## 2014-04-20 DIAGNOSIS — Z7901 Long term (current) use of anticoagulants: Secondary | ICD-10-CM

## 2014-04-20 DIAGNOSIS — R079 Chest pain, unspecified: Secondary | ICD-10-CM

## 2014-04-20 DIAGNOSIS — E875 Hyperkalemia: Secondary | ICD-10-CM

## 2014-04-20 LAB — GLUCOSE, CAPILLARY
GLUCOSE-CAPILLARY: 121 mg/dL — AB (ref 70–99)
Glucose-Capillary: 123 mg/dL — ABNORMAL HIGH (ref 70–99)

## 2014-04-20 LAB — BASIC METABOLIC PANEL
Anion gap: 6 (ref 5–15)
BUN: 30 mg/dL — AB (ref 6–23)
CO2: 22 mmol/L (ref 19–32)
CREATININE: 2.53 mg/dL — AB (ref 0.50–1.35)
Calcium: 8.5 mg/dL (ref 8.4–10.5)
Chloride: 107 mmol/L (ref 96–112)
GFR calc Af Amer: 27 mL/min — ABNORMAL LOW (ref >90)
GFR calc non Af Amer: 23 mL/min — ABNORMAL LOW (ref >90)
GLUCOSE: 139 mg/dL — AB (ref 70–99)
Potassium: 4.4 mmol/L (ref 3.5–5.1)
Sodium: 135 mmol/L (ref 135–145)

## 2014-04-20 LAB — PROTIME-INR
INR: 1.52 — ABNORMAL HIGH (ref 0.00–1.49)
Prothrombin Time: 18.5 seconds — ABNORMAL HIGH (ref 11.6–15.2)

## 2014-04-20 LAB — TROPONIN I
Troponin I: <0.03 ng/mL (ref <0.031)
Troponin I: <0.03 ng/mL (ref <0.031)

## 2014-04-20 LAB — TSH: TSH: 5.092 u[IU]/mL — AB (ref 0.350–4.500)

## 2014-04-20 MED ORDER — WARFARIN SODIUM 2.5 MG PO TABS: 2.5000 mg | ORAL_TABLET | Freq: Once | ORAL | Status: DC

## 2014-04-20 MED ORDER — LEVOTHYROXINE SODIUM 50 MCG PO TABS: 50.0000 ug | ORAL_TABLET | Freq: Every day | ORAL | Status: AC

## 2014-04-20 MED ORDER — INSULIN GLARGINE 100 UNIT/ML ~~LOC~~ SOLN: 4.0000 [IU] | Freq: Every day | SUBCUTANEOUS | Status: AC

## 2014-04-20 MED ORDER — SENNA 8.6 MG PO TABS: 2.0000 | ORAL_TABLET | Freq: Every day | ORAL | Status: AC

## 2014-04-20 NOTE — Progress Notes (Signed)
CSW (Clinical Child psychotherapistocial Worker) notified pt will need non-emergent ambulance transport home. CSW called pt home number and spoke with pt son. Pt son confirmed address and that he will be home when pt arrives. CSW scheduled transport. Pt nurse notified. CSW signing off.  Mel Langan Lajean Savermbelal, LCSWA Weekend CSW [phone]

## 2014-04-20 NOTE — Care Management Note (Signed)
    Page 1 of 1   04/20/2014     3:15:32 PM CARE MANAGEMENT NOTE 04/20/2014  Patient:  [name],[name]   Account Number:  [id number]  Date Initiated:  04/20/2014  Documentation initiated by:  JEFFRIES,SARAH  Subjective/Objective Assessment:   adm: CHF     Action/Plan:   dishcarge planning   Anticipated DC Date:  04/20/2014   Anticipated DC Plan:  HOME W HOME HEALTH SERVICES      DC Planning Services  CM consult      PAC Choice  HOME HEALTH   Choice offered to / List presented to:  C-4 Adult Children        HH arranged  HH-1 RN  HH-10 DISEASE MANAGEMENT  HH-4 NURSE'S AIDE  HH-6 SOCIAL WORKER      Status of service:  Completed, signed off Medicare Important Message given?   (If response is "NO", the following Medicare IM given date fields will be blank) Date Medicare IM given:   Medicare IM given by:   Date Additional Medicare IM given:   Additional Medicare IM given by:    Discharge Disposition:  HOME W HOME HEALTH SERVICES  Per UR Regulation:    If discussed at Long Length of Stay Meetings, dates discussed:    Comments:  04/20/14 15:00 Cm met with pt and daughter, Elizabeth [phone] to offer choice of home health agency. Elizabeth chooses [facility] for HHRN/Aide/SW. Address and contact informaiton veriified with elizabeth and referral called to [facility] rep, [place].  HHSW for resources in community to give this family respite (CAPs and MOWheels...).  CSW to arrange transportation home. No other CM needs were communicated. Sarah Jeffries, BSN, CM [phone].   

## 2014-04-20 NOTE — Progress Notes (Signed)
UR completed 

## 2014-04-20 NOTE — Discharge Summary (Addendum)
Discharge Summary  [name] ZOX:[id number] DOB: [birth year]  PCP: Dalton RaiderSHAW,[name], MD  Admit date: 04/19/2014 Discharge date: 04/20/2014  Time spent: less than 30mins  Recommendations for Outpatient Follow-up:  1. F/u with pmd in three days to repeat bmp. pmd to continue discuss the use of antidepressant with patient. pmd to f/u on a1c result, currently patient is not on diabetic medication. pmd to repeat tsh in three month 2. Medication change: d/c potassium supplement, started senokot qhs, started synthroid 3. Home health /RN/aids/social worker arranged Discharge Diagnoses:  Active Hospital Problems   Diagnosis Date Noted  . Chronic combined systolic and diastolic CHF (congestive heart failure) 04/19/2014  . CKD (chronic kidney disease) stage 4, GFR 15-29 ml/min 04/19/2014  . Hyperkalemia 04/19/2014  . Paroxysmal atrial fibrillation 06/30/2012    Class: Chronic  . Chest pain 08/22/2011    Resolved Hospital Problems   Diagnosis Date Noted Date Resolved  No resolved problems to display.    Discharge Condition: stable  Diet recommendation: heart healthy/carb modified  Filed Weights   04/19/14 1955  Weight: 103.556 kg (228 lb 4.8 oz)    History of present illness:  80 year old male with a history of coronary artery disease/CABG 14 years ago, insulin dependent diabetes mellitus, hypertension, paroxysmal atrial fibrillation, chronic systolic CHF, and CKD stage IV presents with 2-3 day history of chest pain and shortness of breath. The patient states that the sensation is substernal without any radiation without any exacerbating or alleviating factors. The patient is a poor historian. Much of this history is obtained from speaking with the patient's daughter at the bedside. When asked why he is in the hospital, the patient states that "I'm in bed shape". The patient has not had any nausea, vomiting, diarrhea, abdominal pain, dysuria, hematuria. He complains of constipation  without any hematochezia or melena. The patient's daughter helps him with his medications on a daily basis. Presently, the patient is pain free. At home, the patient is normally wheelchair bound and requires significant assistance for transfers.  In the emergency department, the patient was noted to be hyperkalemic with potassium of 6.7. Albuterol, insulin, D50 were ordered. Was Ordered but Had Not yet Been Given. Serum Creatinine Was Noted to Be 2.62. Sodium 134. WBC 6.6, hemoglobin 14.0, platelets 148,000. BNP was 235. The patient was afebrile with oxygen saturation 100% on room air. EKG Afib showed left bundle branch block with nonspecific ST changes. Chest x-ray was negative for any infiltrate or pulmonary edema  Hospital Course:  Active Problems:   Chest pain   Paroxysmal atrial fibrillation   Chronic combined systolic and diastolic CHF (congestive heart failure)   CKD (chronic kidney disease) stage 4, GFR 15-29 ml/min   Hyperkalemia  Atypical chest pain -As the patient has a history of coronary artery disease with CABG, admit the patient under observation  -negative serial troponins  -EKG with chronic afib, rate controlled, upper branch block, nonspecific ST changes  -chest pain free at time of discharge Hyperkalemia  -Likely due to a combination of the patient's potassium supplementation in the setting of spironolactone -Kayexalate -insulin, D50 and albulterol and calcium gluconate given in ED -back to normal at time of discharge. Potassium supplement stopped. Resumed spironolactone at discharge pmd to repeat bmp in three days Hypothyroidism: tsh elevated, free t4 pending, synthroid 50mcg po qd started.  Chronic constipation: senokot started, correct hypothyroidism  Atrial fibrillation  -Appears chronic  -Continue warfarin  -Continue metoprolol tartrate  -Continue amiodarone -TSH elevated,  Chronic systolic  and diastolic CHF  -Appears compensated  -01/18/2014  echocardiogram EF 25-30 percent, grade 1 diastolic dysfunction  -daily wt -crackles on physical exam likely due to atelectasis, cxr unremarkable.  CKD stage IV  -Baseline creatinine 2.3-2.6  -Give 500 mL normal saline  -Aldactone held in the hospital, restarted at discharge -torsemide held in the hospital, restarted at discharge -continue low dose colchicine  Diabetes mellitus type 2  -Patient normally takes 8 units Lantus at bedtime  -give half home dose-->lantus 4 units -novolog sliding scale -A1C pending  Procedures:  none  Consultations:  none  Discharge Exam: BP 102/64 mmHg  Pulse 54  Temp(Src) 97.9 F (36.6 C) (Oral)  Resp 16  Wt 103.556 kg (228 lb 4.8 oz)  SpO2 100%  General: A&O x 3, NAD, nontoxic, pleasant/cooperative Head/Eye: No conjunctival hemorrhage, no icterus, [place]/AT, No nystagmus ENT: No icterus, No thrush, edentulous, no pharyngeal exudate Neck: No masses, no lymphadenpathy, no bruits CV: IRRR, no rub, no gallop, no S3 Lung: Bibasilar crackles. No wheeze. Good air movement Abdomen: soft/NT, +BS, nondistended, no peritoneal signs Ext: No cyanosis, No rashes, No petechiae, No lymphangitis, No edema  Discharge Instructions You were cared for by a hospitalist during your hospital stay. If you have any questions about your discharge medications or the care you received while you were in the hospital after you are discharged, you can call the unit and asked to speak with the hospitalist on call if the hospitalist that took care of you is not available. Once you are discharged, your primary care physician will handle any further medical issues. Please note that NO REFILLS for any discharge medications will be authorized once you are discharged, as it is imperative that you return to your primary care physician (or establish a relationship with a primary care physician if you do not have one) for your aftercare needs so that they can reassess your need  for medications and monitor your lab values.      Discharge Instructions    Diet - low sodium heart healthy    Complete by:  As directed      Face-to-face encounter (required for Medicare/Medicaid patients)    Complete by:  As directed   I Harbert Fitterer certify that this patient is under my care and that I, or a nurse practitioner or physician's assistant working with me, had a face-to-face encounter that meets the physician face-to-face encounter requirements with this patient on 04/20/2014. The encounter with the patient was in whole, or in part for the following medical condition(s) which is the primary reason for home health care (List medical condition): FTT  The encounter with the patient was in whole, or in part, for the following medical condition, which is the primary reason for home health care:  FTT/bed to wheelchair bound  I certify that, based on my findings, the following services are medically necessary home health services:  Nursing  Reason for Medically Necessary Home Health Services:  Skilled Nursing- Change/Decline in Patient Status  My clinical findings support the need for the above services:  Bedbound  Further, I certify that my clinical findings support that this patient is homebound due to:  Mental confusion     Home Health    Complete by:  As directed   To provide the following care/treatments:   RN Social work Home Health Aide       Increase activity slowly    Complete by:  As directed  Medication List    STOP taking these medications        potassium chloride 10 MEQ tablet  Commonly known as:  K-DUR,KLOR-CON      TAKE these medications        amiodarone 200 MG tablet  Commonly known as:  PACERONE  TAKE 1 TABLET (200 MG TOTAL) BY MOUTH DAILY.     aspirin EC 81 MG tablet  Take 81 mg by mouth daily.     atorvastatin 40 MG tablet  Commonly known as:  LIPITOR  Take 40 mg by mouth at bedtime.     brimonidine 0.2 % ophthalmic solution  Commonly  known as:  ALPHAGAN  Place 1 drop into the right eye 2 (two) times daily.     clonazePAM 0.5 MG tablet  Commonly known as:  KLONOPIN  Take 0.5 mg by mouth at bedtime as needed. For anxiety     clotrimazole-betamethasone cream  Commonly known as:  LOTRISONE  Apply to affected area 2 times daily prn     colchicine 0.6 MG tablet  Take 0.5 tablets (0.3 mg total) by mouth daily.     DiphenhydrAMINE HCl 50 MG/30ML Liqd  Take 25 mg by mouth at bedtime as needed (for sleep).     insulin glargine 100 UNIT/ML injection  Commonly known as:  LANTUS  Inject 0.04 mLs (4 Units total) into the skin at bedtime.     isosorbide mononitrate 60 MG 24 hr tablet  Commonly known as:  IMDUR  Take 1 tablet (60 mg total) by mouth daily.     latanoprost 0.005 % ophthalmic solution  Commonly known as:  XALATAN  Place 1 drop into both eyes at bedtime.     levothyroxine 50 MCG tablet  Commonly known as:  SYNTHROID  Take 1 tablet (50 mcg total) by mouth daily before breakfast.     metoprolol tartrate 12.5 mg Tabs tablet  Commonly known as:  LOPRESSOR  Take 1 tablet (25 mg total) by mouth 2 (two) times daily.     senna 8.6 MG Tabs tablet  Commonly known as:  SENOKOT  Take 2 tablets (17.2 mg total) by mouth at bedtime.     spironolactone 25 MG tablet  Commonly known as:  ALDACTONE  TAKE 1 TABLET (25 MG TOTAL) BY MOUTH DAILY.     torsemide 20 MG tablet  Commonly known as:  DEMADEX  Take 1 tablet (20 mg total) by mouth daily.     ULORIC 80 MG Tabs  Generic drug:  Febuxostat  Take 1 tablet by mouth daily.     warfarin 2.5 MG tablet  Commonly known as:  COUMADIN  - Take 1.25-2.5 mg by mouth daily at 6 PM. TAKES 2.5MG  ON TUES ONLY  - TAKES 1.25MG  ALL OTHER DAYS       Allergies  Allergen Reactions  . Other Other (See Comments)    Ear drops  I used made my throat feel funny    Follow-up Information    Follow up with Hill,[name], MD In 3 days.   Specialty:  Family Medicine   Contact  information:   [address] [place] [address] [phone]       Follow up with [facility].   Why:  home health nurse, aide, social worker   Contact information:   [address] [place] [address] [phone]        The results of significant diagnostics from this hospitalization (including imaging,  microbiology, ancillary and laboratory) are listed below for reference.    Significant Diagnostic Studies: Dg Chest Port 1 View  04/19/2014   CLINICAL DATA:  Shortness of breath, chest pain and palpitations.  EXAM: PORTABLE CHEST - 1 VIEW  COMPARISON:  07/02/2012  FINDINGS: The heart size is stable and mildly enlarged status post prior CABG. Lung volumes are low with bibasilar atelectasis present. There is no evidence of pulmonary edema, consolidation, pneumothorax, nodule or pleural fluid.  IMPRESSION: Stable cardiomegaly. Low volumes with bibasilar atelectasis. No active disease.   Electronically Signed   By: Irish Lack M.D.   On: 04/19/2014 15:09   Dg Abd 2 Views  04/19/2014   CLINICAL DATA:  Chest pain and dyspnea for 2-3 days  EXAM: ABDOMEN - 2 VIEW  COMPARISON:  None.  FINDINGS: The bowel gas pattern is normal. There is no evidence of free air. No radio-opaque calculi or other significant radiographic abnormality is seen.  IMPRESSION: Negative.   Electronically Signed   By: Ellery Plunk M.D.   On: 04/19/2014 23:20    Microbiology: No results found for this or any previous visit (from the past 240 hour(s)).   Labs: Basic Metabolic Panel:  Recent Labs Lab 04/19/14 1434 04/19/14 1609 04/20/14 0303  NA 134*  --  135  K 6.7* 4.4 4.4  CL 103  --  107  CO2 23  --  22  GLUCOSE 151*  --  139*  BUN 28*  --  30*  CREATININE 2.62*  --  2.53*  CALCIUM 8.7  --  8.5   Liver Function Tests: No results for input(s): AST, ALT, ALKPHOS, BILITOT, PROT, ALBUMIN in the last 168 hours. No results for input(s): LIPASE, AMYLASE in  the last 168 hours. No results for input(s): AMMONIA in the last 168 hours. CBC:  Recent Labs Lab 04/19/14 1434  WBC 6.6  HGB 14.0  HCT 42.3  MCV 96.4  PLT 148*   Cardiac Enzymes:  Recent Labs Lab 04/19/14 2301 04/20/14 0303 04/20/14 0945  CKTOTAL 31  --   --   TROPONINI 0.03 <0.03 <0.03   BNP: BNP (last 3 results)  Recent Labs  04/19/14 1434  BNP 235.8*    ProBNP (last 3 results) No results for input(s): PROBNP in the last 8760 hours.  CBG:  Recent Labs Lab 04/19/14 2157 04/20/14 0724 04/20/14 1154  GLUCAP 168* 121* 123*       Signed:  Tyrell Seifer  [facility] 04/20/2014, 2:11 PM

## 2014-04-20 NOTE — Progress Notes (Signed)
INITIAL NUTRITION ASSESSMENT  INTERVENTION: Continue heart healthy / CHO modified diet  ProStat 30 ml daily (each 30 ml provides 100 kcal, 15 gr protein)   NUTRITION DIAGNOSIS: Inadequate oral intake  related to diminished appetite as evidenced by meal intake 50%  meals  Goal: Pt to meet >/= 90% of their estimated nutrition needs    Monitor:  meal intake, labs and wt trends   Reason for Assessment: evaluate nutrition requirments  80 y.o. male  ASSESSMENT: Pt has hx of DM type 2, gout, CKD stage IV, HTN, HLD, CHF and stroke. Pt denies weight loss and describes appetite as fair. His weight varabiltiy may be due to fluid status changes. Labs: BUN 30 and Creat 2.53 high  Nutrition Focused Physical Exam:  Subcutaneous Fat:  Orbital Region: well nourished Upper Arm Region: well nourished Thoracic and Lumbar Region: well nourished  Muscle:  Temple Region: WNL Clavicle Bone Region: well nourished Clavicle and Acromion Bone Region: well nourished Scapular Bone Region: well nourished Dorsal Hand: WNL Patellar Region: mild wasting Anterior Thigh Region: mild wasting  Posterior Calf Region: WNL  Edema: none noted   Height: Ht Readings from Last 1 Encounters:  01/19/14  (1.905 m)    Weight: Wt Readings from Last 1 Encounters:  04/19/14 228 lb 4.8 oz (103.556 kg)    Ideal Body Weight: 196# (89 kg)  % Ideal Body Weight: 116%  Wt Readings from Last 10 Encounters:  04/19/14 228 lb 4.8 oz (103.556 kg)  01/19/14 238 lb 15.7 oz (108.401 kg)  09/20/13 260 lb (117.935 kg)  03/20/13 262 lb (118.842 kg)  07/07/12 241 lb 10 oz (109.6 kg)  08/25/11 271 lb 13.2 oz (123.3 kg)    Usual Body Weight: 235-245#  % Usual Body Weight: 96%  BMI:  Body mass index is 28.54 kg/(m^2).overweight  Estimated Nutritional Needs: Kcal: 1610-9604 Protein: 72-82 gr Fluid: 2.5 liters daily  Skin: intact  Diet Order: Diet heart healthy/carb modified  EDUCATION NEEDS: -No  education needs identified at this time   Intake/Output Summary (Last 24 hours) at 04/20/14 1008 Last data filed at 04/20/14 0600  Gross per 24 hour  Intake 768.75 ml  Output    475 ml  Net 293.75 ml    Last BM: 04/19/14  Labs:   Recent Labs Lab 04/19/14 1434 04/19/14 1609 04/20/14 0303  NA 134*  --  135  K 6.7* 4.4 4.4  CL 103  --  107  CO2 23  --  22  BUN 28*  --  30*  CREATININE 2.62*  --  2.53*  CALCIUM 8.7  --  8.5  GLUCOSE 151*  --  139*    CBG (last 3)   Recent Labs  04/19/14 2157 04/20/14 0724  GLUCAP 168* 121*    Scheduled Meds: . amiodarone  200 mg Oral Daily  . aspirin EC  81 mg Oral Daily  . atorvastatin  40 mg Oral QHS  . brimonidine  1 drop Right Eye BID  . calcium gluconate  1 g Intravenous Once  . dextrose  1 ampule Intravenous Once  . febuxostat  80 mg Oral Daily  . insulin aspart  0-9 Units Subcutaneous TID WC  . insulin aspart  10 Units Intravenous Once  . insulin glargine  4 Units Subcutaneous QHS  . isosorbide mononitrate  60 mg Oral Daily  . latanoprost  1 drop Both Eyes QHS  . metoprolol  12.5 mg Oral BID  . sodium chloride  3 mL  Intravenous Q12H  . Warfarin - Pharmacist Dosing Inpatient   Does not apply q1800    Continuous Infusions: . albuterol Stopped (04/19/14 1700)    Past Medical History  Diagnosis Date  . Gout   . Diabetes mellitus   . Hypertension   . Hyperlipidemia   . Coronary artery disease   . Cerebral embolism   . Paroxysmal atrial fibrillation   . Chronic systolic heart failure   . Stroke     Past Surgical History  Procedure Laterality Date  . Coronary artery bypass graft    . Cholecystectomy      .Royann ShiversLynn Aailyah Dunbar MS,RD,CSG,LDN Office: [phone] Pager: [phone]

## 2014-04-20 NOTE — Progress Notes (Signed)
ANTICOAGULATION CONSULT NOTE - Follow Up Consult  Pharmacy Consult for coumadin Indication: atrial fibrillation  Allergies  Allergen Reactions  . Other Other (See Comments)    Ear drops  I used made my throat feel funny     Vital Signs: Temp: 97.9 F (36.6 C) (02/20 0500) Temp Source: Oral (02/20 0500) BP: 102/64 mmHg (02/20 1006) Pulse Rate: 54 (02/20 1006)  Labs:  Recent Labs  04/19/14 1434 04/19/14 1609 04/19/14 2301 04/20/14 0303 04/20/14 0945  HGB 14.0  --   --   --   --   HCT 42.3  --   --   --   --   PLT 148*  --   --   --   --   LABPROT  --  18.9*  --   --  18.5*  INR  --  1.57*  --   --  1.52*  CREATININE 2.62*  --   --  2.53*  --   CKTOTAL  --   --  31  --   --   TROPONINI  --   --  0.03 <0.03 <0.03    Estimated Creatinine Clearance: 31.9 mL/min (by C-G formula based on Cr of 2.53).   Medical History: Past Medical History  Diagnosis Date  . Gout   . Diabetes mellitus   . Hypertension   . Hyperlipidemia   . Coronary artery disease   . Cerebral embolism   . Paroxysmal atrial fibrillation   . Chronic systolic heart failure   . Stroke     Medications:  Prescriptions prior to admission  Medication Sig Dispense Refill Last Dose  . amiodarone (PACERONE) 200 MG tablet TAKE 1 TABLET (200 MG TOTAL) BY MOUTH DAILY. 30 tablet 4 04/19/2014 at 900  . aspirin EC 81 MG tablet Take 81 mg by mouth daily.   04/19/2014 at 900  . atorvastatin (LIPITOR) 40 MG tablet Take 40 mg by mouth at bedtime.    04/18/2014 at 2200  . brimonidine (ALPHAGAN) 0.2 % ophthalmic solution Place 1 drop into the right eye 2 (two) times daily.   unknown  . clonazePAM (KLONOPIN) 0.5 MG tablet Take 0.5 mg by mouth at bedtime as needed. For anxiety   04/18/2014 at 2200  . clotrimazole-betamethasone (LOTRISONE) cream Apply to affected area 2 times daily prn 15 g 0 04/19/2014 at 900  . colchicine 0.6 MG tablet Take 0.5 tablets (0.3 mg total) by mouth daily.   04/19/2014 at 900  . DiphenhydrAMINE  HCl 50 MG/30ML LIQD Take 25 mg by mouth at bedtime as needed (for sleep).   04/18/2014 at 2200  . Febuxostat (ULORIC) 80 MG TABS Take 1 tablet by mouth daily.   04/19/2014 at 900  . isosorbide mononitrate (IMDUR) 60 MG 24 hr tablet Take 1 tablet (60 mg total) by mouth daily. 30 tablet 1 04/19/2014 at 900  . latanoprost (XALATAN) 0.005 % ophthalmic solution Place 1 drop into both eyes at bedtime.   04/18/2014 at 2200  . metoprolol (LOPRESSOR) 12.5 mg TABS tablet Take 1 tablet (25 mg total) by mouth 2 (two) times daily. (Patient taking differently: Take 12.5 mg by mouth 2 (two) times daily. ) 30 tablet 0 04/19/2014 at 0900  . potassium chloride (K-DUR,KLOR-CON) 10 MEQ tablet Take 1 tablet (10 mEq total) by mouth daily. 30 tablet 3 04/19/2014 at 900  . spironolactone (ALDACTONE) 25 MG tablet TAKE 1 TABLET (25 MG TOTAL) BY MOUTH DAILY. 30 tablet 6 04/19/2014 at 900  . torsemide (DEMADEX)  20 MG tablet Take 1 tablet (20 mg total) by mouth daily.   04/19/2014 at 900  . warfarin (COUMADIN) 2.5 MG tablet Take 1.25-2.5 mg by mouth daily at 6 PM. TAKES 2.5MG  ON TUES ONLY TAKES 1.25MG  ALL OTHER DAYS   04/18/2014 at 2200   Scheduled:  . amiodarone  200 mg Oral Daily  . aspirin EC  81 mg Oral Daily  . atorvastatin  40 mg Oral QHS  . brimonidine  1 drop Right Eye BID  . calcium gluconate  1 g Intravenous Once  . dextrose  1 ampule Intravenous Once  . febuxostat  80 mg Oral Daily  . insulin aspart  0-9 Units Subcutaneous TID WC  . insulin aspart  10 Units Intravenous Once  . insulin glargine  4 Units Subcutaneous QHS  . isosorbide mononitrate  60 mg Oral Daily  . latanoprost  1 drop Both Eyes QHS  . metoprolol  12.5 mg Oral BID  . sodium chloride  3 mL Intravenous Q12H  . Warfarin - Pharmacist Dosing Inpatient   Does not apply q1800    Assessment: 80 yo male admitted 04/19/2014  with CP. He is on coumadin PTA for afib and pharmacy has been consulted to dose.   PMH: coronary artery disease w/ CABG 14 years ago,  diabetes mellitus, hypertension, paroxysmal atrial fibrillation, chronic systolic CHF, and CKD stage IV   Coag: Afib,  INR remains below goal.  He was noted at goal at his last coumadin clinic visit on 03/29/14.  No bleeding noted, no cbc today. Home coumadin regimen: 1.25mg /day except takes 2.5mg  on Tuesday (last dose 04/18/14)  Renal/FEN: CrCl ~30 ml/min,   Goal of Therapy:  INR 2-3 Monitor platelets by anticoagulation protocol: Yes   Plan:  -Coumadin 2.5mg  po today -Daily PT/INR  Thank you for allowing pharmacy to be a part of this patients care team.  Lovenia KimJulie Saafir Abdullah Pharm.D., BCPS, AQ-Cardiology Clinical Pharmacist 04/20/2014 11:17 AM Pager: [phone] Phone: [phone]

## 2014-04-23 NOTE — Telephone Encounter (Signed)
FYI routed to Dr.Smith 

## 2014-04-23 NOTE — Telephone Encounter (Signed)
New message    Wife calling     Dalton FlesherWent to hospital recently discharge on sat.

## 2014-04-25 LAB — HEMOGLOBIN A1C
Hgb A1c MFr Bld: 6.2 % — ABNORMAL HIGH (ref 4.8–5.6)
Mean Plasma Glucose: 131 mg/dL

## 2014-04-28 ENCOUNTER — Other Ambulatory Visit: Payer: Self-pay | Admitting: Interventional Cardiology

## 2014-05-03 ENCOUNTER — Ambulatory Visit (INDEPENDENT_AMBULATORY_CARE_PROVIDER_SITE_OTHER): Payer: Medicare Other | Admitting: Physician Assistant

## 2014-05-03 ENCOUNTER — Encounter: Payer: Self-pay | Admitting: Physician Assistant

## 2014-05-03 VITALS — BP 101/64 | HR 52 | Ht 75.0 in | Wt 228.0 lb

## 2014-05-03 DIAGNOSIS — E875 Hyperkalemia: Secondary | ICD-10-CM

## 2014-05-03 DIAGNOSIS — E785 Hyperlipidemia, unspecified: Secondary | ICD-10-CM

## 2014-05-03 DIAGNOSIS — I480 Paroxysmal atrial fibrillation: Secondary | ICD-10-CM

## 2014-05-03 DIAGNOSIS — I2510 Atherosclerotic heart disease of native coronary artery without angina pectoris: Secondary | ICD-10-CM

## 2014-05-03 DIAGNOSIS — I255 Ischemic cardiomyopathy: Secondary | ICD-10-CM | POA: Diagnosis not present

## 2014-05-03 DIAGNOSIS — I639 Cerebral infarction, unspecified: Secondary | ICD-10-CM

## 2014-05-03 DIAGNOSIS — I10 Essential (primary) hypertension: Secondary | ICD-10-CM

## 2014-05-03 DIAGNOSIS — N183 Chronic kidney disease, stage 3 unspecified: Secondary | ICD-10-CM

## 2014-05-03 DIAGNOSIS — I5022 Chronic systolic (congestive) heart failure: Secondary | ICD-10-CM | POA: Diagnosis not present

## 2014-05-03 DIAGNOSIS — I6350 Cerebral infarction due to unspecified occlusion or stenosis of unspecified cerebral artery: Secondary | ICD-10-CM

## 2014-05-03 NOTE — Patient Instructions (Signed)
Your physician recommends that you schedule a follow-up appointment in: 3 MONTHS WITH DR. Katrinka BlazingSMITH  NO CHANGES WERE MADE TODAY

## 2014-05-03 NOTE — Progress Notes (Signed)
Cardiology Office Note   Date:  05/03/2014   ID:  [name] Sr., DOB Dec 19, 1934, MRN [id number]  PCP:  [name],[name], MD  Cardiologist:  Dr. Verdis PrimeHenry Hill    Chief Complaint  Patient presents with  . Chest Pain    Hosp FU     History of Present Illness: [name] Sr. is a 80 y.o. 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  Asked seen by Dr. Katrinka BlazingSmith 7/15  Admitted 12/2013. He presented with generalized weakness and rash. He was bradycardic and his beta blocker dose was reduced. He was noted to have acute kidney injury in his torsemide dose was reduced. Echocardiogram demonstrated EF 25-30%. Previous EF was 10-15%.  Admitted 2/19-2/20 with chest pain. Cardiac enzymes remained normal. He had hyperkalemia. This was corrected. Of note, he was placed back on spironolactone at discharge.    He returns for FU.  He is here with his son. He is sitting in a wheelchair slumped forward. His son tells me that he is too weak to lift his head. [name] is able to answer all of my questions. He has not had recurrent chest pain since he went to the emergency room 2 weeks ago. He is sedentary. He denies shortness of breath at rest. He denies LE edema. He denies syncope. His appetite is poor. His primary care physician has asked him to start drinking Glucerna. His son tells me that his dementia seems to be getting worse. He does have a nurse coming to his house to help with some of his ADLs.   Studies/Reports Reviewed Today:  Echocardiogram 01/18/14 - EF 25-30%. There is akinesis of the inferolateral myocardium. Grade 1 diastolic dysfunction. - Mitral valve: There was mild regurgitation. - Left atrium: The atrium was mildly dilated.  Myoview 07/2011 IMPRESSION: 1. Negative for pharmacologic-stress induced ischemia. 2. Fixed apical defect extending into the anteroseptal and inferolateral walls,  suggesting scarring from prior infarct. 3. Dilated left ventricle with global hypokinesis and apical dyskinesis. 4. Decreased left ventricular ejection fraction of 19%.   Past Medical History  Diagnosis Date  . Gout   . Diabetes mellitus   . Hypertension   . Hyperlipidemia   . Coronary artery disease   . Cerebral embolism   . Paroxysmal atrial fibrillation   . Chronic systolic heart failure   . Stroke     Past Surgical History  Procedure Laterality Date  . Coronary artery bypass graft    . Cholecystectomy       Current Outpatient Prescriptions  Medication Sig Dispense Refill  . amiodarone (PACERONE) 200 MG tablet TAKE 1 TABLET (200 MG TOTAL) BY MOUTH DAILY. 30 tablet 4  . aspirin EC 81 MG tablet Take 81 mg by mouth daily.    Marland Kitchen. atorvastatin (LIPITOR) 40 MG tablet Take 40 mg by mouth at bedtime.     . brimonidine (ALPHAGAN) 0.2 % ophthalmic solution Place 1 drop into the right eye 2 (two) times daily.    . clonazePAM (KLONOPIN) 0.5 MG tablet Take 0.5 mg by mouth at bedtime as needed. For anxiety    . clotrimazole-betamethasone (LOTRISONE) cream Apply to affected area 2 times daily prn 15 g 0  . colchicine 0.6 MG tablet Take 0.5 tablets (0.3 mg total) by mouth daily.    . DiphenhydrAMINE HCl 50 MG/30ML LIQD Take 25 mg by mouth at  bedtime as needed (for sleep).    . Febuxostat (ULORIC) 80 MG TABS Take 1 tablet by mouth daily.    . insulin glargine (LANTUS) 100 UNIT/ML injection Inject 0.04 mLs (4 Units total) into the skin at bedtime. 10 mL 11  . isosorbide mononitrate (IMDUR) 60 MG 24 hr tablet Take 1 tablet (60 mg total) by mouth daily. 30 tablet 1  . latanoprost (XALATAN) 0.005 % ophthalmic solution Place 1 drop into both eyes at bedtime.    Marland Kitchen levothyroxine (SYNTHROID) 50 MCG tablet Take 1 tablet (50 mcg total) by mouth daily before breakfast. 30 tablet 0  . metoprolol (LOPRESSOR) 12.5 mg TABS tablet Take 1 tablet (25 mg total) by mouth 2 (two) times daily. (Patient taking  differently: Take 12.5 mg by mouth 2 (two) times daily. ) 30 tablet 0  . senna (SENOKOT) 8.6 MG TABS tablet Take 2 tablets (17.2 mg total) by mouth at bedtime. 120 each 0  . spironolactone (ALDACTONE) 25 MG tablet TAKE 1 TABLET (25 MG TOTAL) BY MOUTH DAILY. 30 tablet 0  . torsemide (DEMADEX) 20 MG tablet Take 1 tablet (20 mg total) by mouth daily.    Marland Kitchen warfarin (COUMADIN) 2.5 MG tablet Take 1.25-2.5 mg by mouth daily at 6 PM. TAKES 2.5MG  ON TUES ONLY TAKES 1.25MG  ALL OTHER DAYS     No current facility-administered medications for this visit.    Allergies:   Other    Social History:  The patient  reports that he quit smoking about 46 years ago. His smoking use included Cigarettes. He has a 25 pack-year smoking history. He does not have any smokeless tobacco history on file. He reports that he does not drink alcohol.   Family History:  The patient's family history includes Coronary artery disease in his maternal aunt. There is no history of Heart attack or Stroke.    ROS:   Please see the history of present illness.   Review of Systems  Constitution: Positive for decreased appetite.  Eyes: Positive for visual disturbance.  All other systems reviewed and are negative.     PHYSICAL EXAM: VS:  There were no vitals taken for this visit.    Wt Readings from Last 3 Encounters:  04/19/14 228 lb 4.8 oz (103.556 kg)  01/19/14 238 lb 15.7 oz (108.401 kg)  09/20/13 260 lb (117.935 kg)     GEN: Chronically ill-appearing male slumped forward in a wheelchair, no acute distress HEENT: normal Neck: no JVD at 90, no masses Cardiac:  Normal S1/S2, RRR; 2/6 systolic murmur LSB,  no rubs or gallops, no edema  Respiratory:  clear to auscultation bilaterally, no wheezing, rhonchi or rales. GI: soft, nontender, nondistended, + BS MS: no deformity or atrophy Skin: warm and dry  Neuro:  CNs II-XII intact, Strength and sensation are intact Psych: Normal affect   EKG:  EKG is ordered today.  It  demonstrates:   Sinus bradycardia, HR 52, LAD, IVCD, no change from prior tracing   Recent Labs: 01/17/2014: ALT 64* 04/19/2014: B Natriuretic Peptide 235.8*; Hemoglobin 14.0; Platelets 148*; TSH 5.092* 04/20/2014: BUN 30*; Creatinine 2.53*; Potassium 4.4; Sodium 135    Lipid Panel    Component Value Date/Time   CHOL 85 08/23/2011 0336   TRIG 83 08/23/2011 0336   HDL 32* 08/23/2011 0336   CHOLHDL 2.7 08/23/2011 0336   VLDL 17 08/23/2011 0336   LDLCALC 36 08/23/2011 0336      ASSESSMENT AND PLAN:  Coronary artery disease:  No further chest  pain. He is not a candidate for aggressive invasive evaluation given his comorbid conditions and chronic renal failure. Continue aspirin, statin, nitrates, beta blocker.  Chronic systolic heart failure:  Volume appears stable. Recent labs were obtained by primary care. Continue current dose of spironolactone and torsemide.  Ischemic cardiomyopathy:  He is not a candidate for ICD. He is not on ACE inhibitor secondary to chronic kidney disease. Continue nitrates, Aldactone, metoprolol.  Paroxysmal atrial fibrillation:  Maintaining NSR. Continue amiodarone. Recent TSH and LFTs okay.  Cerebral artery occlusion with cerebral infarction:  Continue aspirin.  Hyperkalemia:  Follow-up labs have been obtained by primary care. We will request those for our records.  CKD (chronic kidney disease), stage III:  As noted, follow-up labs will be obtained.  Essential hypertension:  Controlled.  HLD (hyperlipidemia):  Continue statin.  End-Of-Life:  I had a long discussion with the patient and his son today about end-of-life issues. The patient seems adamant that he would want to be a DO NOT RESUSCITATE. His son is not so sure. Dr. Katrinka Blazing also spoke to the patient and his son. He will have his wife call Dr. Katrinka Blazing later today to possibly discuss hospice.   Current medicines are reviewed at length with the patient today.  The patient does not have concerns  regarding medicines.  The following changes have been made:  no change  Labs/ tests ordered today include:   Orders Placed This Encounter  Procedures  . EKG 12-Lead     Disposition:   FU with Dr. Verdis Prime 3 mos.    Signed, Brynda Rim, MHS 05/03/2014 10:00 AM    [facility] [address] [place], [place], [place]  [address] Phone: [phone]; Fax: [phone]

## 2014-05-05 ENCOUNTER — Other Ambulatory Visit: Payer: Self-pay | Admitting: Interventional Cardiology

## 2014-05-06 ENCOUNTER — Telehealth: Payer: Self-pay

## 2014-05-06 NOTE — Telephone Encounter (Signed)
Hospice referral initiated. Cory [facility] will make contact with the pt and family

## 2014-05-31 ENCOUNTER — Telehealth: Payer: Self-pay | Admitting: *Deleted

## 2014-05-31 ENCOUNTER — Other Ambulatory Visit: Payer: Self-pay | Admitting: Interventional Cardiology

## 2014-05-31 NOTE — Telephone Encounter (Signed)
Left a message with family member to have patient call us in regards to Coumadin refill request. Coumadin Clinic Telephone provided. Corleen Otwell, Sherrilee Gillesandance Bianca, RN

## 2014-06-04 ENCOUNTER — Other Ambulatory Visit: Payer: Self-pay | Admitting: Interventional Cardiology

## 2014-06-13 ENCOUNTER — Ambulatory Visit (INDEPENDENT_AMBULATORY_CARE_PROVIDER_SITE_OTHER): Payer: Medicare Other | Admitting: Cardiovascular Disease

## 2014-06-13 DIAGNOSIS — I6350 Cerebral infarction due to unspecified occlusion or stenosis of unspecified cerebral artery: Secondary | ICD-10-CM

## 2014-06-13 DIAGNOSIS — I639 Cerebral infarction, unspecified: Secondary | ICD-10-CM

## 2014-06-13 LAB — POCT INR: INR: 2.1

## 2014-07-04 ENCOUNTER — Telehealth: Payer: Self-pay | Admitting: Interventional Cardiology

## 2014-07-04 NOTE — Telephone Encounter (Signed)
New Message  Victorino [facility]Jennifer [facility] calling about orders for continuing [facility] Health,. Please call back and discuss.

## 2014-07-04 NOTE — Telephone Encounter (Signed)
[facility]. Lmom. Verbal order given to continue pt home health service.

## 2014-08-02 ENCOUNTER — Emergency Department (HOSPITAL_COMMUNITY): Payer: Medicare Other

## 2014-08-02 ENCOUNTER — Encounter (HOSPITAL_COMMUNITY): Payer: Self-pay | Admitting: Emergency Medicine

## 2014-08-02 ENCOUNTER — Emergency Department (HOSPITAL_COMMUNITY)
Admission: EM | Admit: 2014-08-02 | Discharge: 2014-08-03 | Disposition: A | Discharge status: Home / Self Care | Payer: Medicare Other | Attending: Emergency Medicine | Admitting: Emergency Medicine

## 2014-08-02 DIAGNOSIS — E785 Hyperlipidemia, unspecified: Secondary | ICD-10-CM | POA: Diagnosis not present

## 2014-08-02 DIAGNOSIS — Z8739 Personal history of other diseases of the musculoskeletal system and connective tissue: Secondary | ICD-10-CM | POA: Diagnosis not present

## 2014-08-02 DIAGNOSIS — Z79899 Other long term (current) drug therapy: Secondary | ICD-10-CM | POA: Diagnosis not present

## 2014-08-02 DIAGNOSIS — E119 Type 2 diabetes mellitus without complications: Secondary | ICD-10-CM | POA: Diagnosis not present

## 2014-08-02 DIAGNOSIS — I10 Essential (primary) hypertension: Secondary | ICD-10-CM | POA: Insufficient documentation

## 2014-08-02 DIAGNOSIS — R55 Syncope and collapse: Secondary | ICD-10-CM | POA: Diagnosis present

## 2014-08-02 DIAGNOSIS — I2510 Atherosclerotic heart disease of native coronary artery without angina pectoris: Secondary | ICD-10-CM | POA: Insufficient documentation

## 2014-08-02 DIAGNOSIS — G40A09 Absence epileptic syndrome, not intractable, without status epilepticus: Secondary | ICD-10-CM | POA: Insufficient documentation

## 2014-08-02 DIAGNOSIS — Z7901 Long term (current) use of anticoagulants: Secondary | ICD-10-CM | POA: Diagnosis not present

## 2014-08-02 DIAGNOSIS — I5022 Chronic systolic (congestive) heart failure: Secondary | ICD-10-CM | POA: Insufficient documentation

## 2014-08-02 DIAGNOSIS — Z7982 Long term (current) use of aspirin: Secondary | ICD-10-CM | POA: Insufficient documentation

## 2014-08-02 DIAGNOSIS — Z8673 Personal history of transient ischemic attack (TIA), and cerebral infarction without residual deficits: Secondary | ICD-10-CM | POA: Diagnosis not present

## 2014-08-02 DIAGNOSIS — Z794 Long term (current) use of insulin: Secondary | ICD-10-CM | POA: Insufficient documentation

## 2014-08-02 DIAGNOSIS — Z87891 Personal history of nicotine dependence: Secondary | ICD-10-CM | POA: Insufficient documentation

## 2014-08-02 DIAGNOSIS — I480 Paroxysmal atrial fibrillation: Secondary | ICD-10-CM | POA: Diagnosis not present

## 2014-08-02 DIAGNOSIS — IMO0001 Reserved for inherently not codable concepts without codable children: Secondary | ICD-10-CM

## 2014-08-02 LAB — CBC
HEMATOCRIT: 40.8 % (ref 39.0–52.0)
HEMOGLOBIN: 13.4 g/dL (ref 13.0–17.0)
MCH: 31.8 pg (ref 26.0–34.0)
MCHC: 32.8 g/dL (ref 30.0–36.0)
MCV: 96.7 fL (ref 78.0–100.0)
PLATELETS: 167 10*3/uL (ref 150–400)
RBC: 4.22 MIL/uL (ref 4.22–5.81)
RDW: 16.2 % — AB (ref 11.5–15.5)
WBC: 7.6 10*3/uL (ref 4.0–10.5)

## 2014-08-02 LAB — URINALYSIS, ROUTINE W REFLEX MICROSCOPIC
Bilirubin Urine: NEGATIVE
GLUCOSE, UA: NEGATIVE mg/dL
HGB URINE DIPSTICK: NEGATIVE
KETONES UR: NEGATIVE mg/dL
Leukocytes, UA: NEGATIVE
Nitrite: NEGATIVE
Protein, ur: NEGATIVE mg/dL
Specific Gravity, Urine: 1.01 (ref 1.005–1.030)
Urobilinogen, UA: 2 mg/dL — ABNORMAL HIGH (ref 0.0–1.0)
pH: 5.5 (ref 5.0–8.0)

## 2014-08-02 LAB — COMPREHENSIVE METABOLIC PANEL
ALT: 31 U/L (ref 17–63)
AST: 56 U/L — AB (ref 15–41)
Albumin: 2.7 g/dL — ABNORMAL LOW (ref 3.5–5.0)
Alkaline Phosphatase: 129 U/L — ABNORMAL HIGH (ref 38–126)
Anion gap: 12 (ref 5–15)
BILIRUBIN TOTAL: 0.7 mg/dL (ref 0.3–1.2)
BUN: 39 mg/dL — AB (ref 6–20)
CALCIUM: 8.9 mg/dL (ref 8.9–10.3)
CO2: 19 mmol/L — ABNORMAL LOW (ref 22–32)
Chloride: 107 mmol/L (ref 101–111)
Creatinine, Ser: 2.82 mg/dL — ABNORMAL HIGH (ref 0.61–1.24)
GFR calc Af Amer: 23 mL/min — ABNORMAL LOW (ref >60)
GFR calc non Af Amer: 20 mL/min — ABNORMAL LOW (ref >60)
Glucose, Bld: 169 mg/dL — ABNORMAL HIGH (ref 65–99)
Potassium: 4.7 mmol/L (ref 3.5–5.1)
SODIUM: 138 mmol/L (ref 135–145)
Total Protein: 7.5 g/dL (ref 6.5–8.1)

## 2014-08-02 LAB — I-STAT TROPONIN, ED: Troponin i, poc: 0.02 ng/mL (ref 0.00–0.08)

## 2014-08-02 LAB — PROTIME-INR
INR: 2.04 — ABNORMAL HIGH (ref 0.00–1.49)
Prothrombin Time: 22.9 seconds — ABNORMAL HIGH (ref 11.6–15.2)

## 2014-08-02 MED ORDER — ACETAMINOPHEN 325 MG PO TABS
650.0000 mg | ORAL_TABLET | Freq: Once | ORAL | Status: AC
  Administered 2014-08-02: 650 mg via ORAL
  Filled 2014-08-02: qty 2

## 2014-08-02 NOTE — ED Notes (Addendum)
Per EMS family called for pt complaint of unconsciousness. Upon EMS arrival pt appears per normal; alert to self. Family reports this is pt normal; family could not verbalize reason for pt to be transferred "but seemed to be more interested in leaving for granddaughter's graduation." Pt saturated in urine upon arrival; plan to more to room when available to assist cleaning/pericare.  EMS reports bradycardic and hypotensive per norm.

## 2014-08-02 NOTE — ED Notes (Signed)
Pt son, Clent DemarkLonnie Netzer JR, via phone after eating pt "went into state of shock...eyes were wide open and his was looking at the ceiling for a few minutes." Son continues to reports pt did not choke and is back to norm at present time.

## 2014-08-02 NOTE — Discharge Instructions (Signed)

## 2014-08-02 NOTE — ED Notes (Signed)
Pericare administered with catheter insertion. Brief changed as result of saturation in feces and urine.

## 2014-08-02 NOTE — ED Notes (Signed)
Dalton FruitWilliam Hill reports will attempt INR blood draw; unable to collect sufficient sample during prior blood draw.

## 2014-08-02 NOTE — ED Notes (Signed)
[facility] aware of complaint and EMS and family report.

## 2014-08-02 NOTE — ED Notes (Signed)
Per lab, pt's CMP was hemolyzed, RN notified.

## 2014-08-02 NOTE — ED Notes (Signed)
[facility] Expected date:  Expected time:  Means of arrival:  Comments: EMS/75M/LOC

## 2014-08-02 NOTE — ED Provider Notes (Signed)
CSN: [id number]     Arrival date & time 08/02/14  1727 History   First MD Initiated Contact with Patient 08/02/14 1735     Chief Complaint  Patient presents with  . Loss of Consciousness     (Consider location/radiation/quality/duration/timing/severity/associated sxs/prior Treatment) Patient is a 80 y.o. male presenting with syncope.  Loss of Consciousness Episode history:  Single Most recent episode:  Today Duration:  2 minutes Timing:  Constant Progression:  Resolved Chronicity:  Recurrent Context comment:  Sitting at dinner, stared into space Witnessed: yes   Worsened by:  Nothing tried Associated symptoms: no anxiety, no focal weakness and no recent fall     Past Medical History  Diagnosis Date  . Gout   . Diabetes mellitus   . Hypertension   . Hyperlipidemia   . Coronary artery disease   . Cerebral embolism   . Paroxysmal atrial fibrillation   . Chronic systolic heart failure   . Stroke    Past Surgical History  Procedure Laterality Date  . Coronary artery bypass graft    . Cholecystectomy     Family History  Problem Relation Age of Onset  . Coronary artery disease Maternal Aunt   . Heart attack Neg Hx   . Stroke Neg Hx    History  Substance Use Topics  . Smoking status: Former Smoker -- 1.00 packs/day for 25 years    Types: Cigarettes    Quit date: 03/01/1968  . Smokeless tobacco: Not on file  . Alcohol Use: No    Review of Systems  Cardiovascular: Positive for syncope.  Neurological: Negative for focal weakness.  All other systems reviewed and are negative.     Allergies  Other  Home Medications   Prior to Admission medications   Medication Sig Start Date End Date Taking? Authorizing Provider  amiodarone (PACERONE) 200 MG tablet TAKE 1 TABLET (200 MG TOTAL) BY MOUTH DAILY. 04/09/14   Lyn Records, MD  aspirin EC 81 MG tablet Take 81 mg by mouth daily.    Historical Provider, MD  atorvastatin (LIPITOR) 40 MG tablet Take 40 mg by mouth at  bedtime.     Historical Provider, MD  brimonidine (ALPHAGAN) 0.2 % ophthalmic solution Place 1 drop into the right eye 2 (two) times daily.    Historical Provider, MD  clonazePAM (KLONOPIN) 0.5 MG tablet Take 0.5 mg by mouth at bedtime as needed. For anxiety    Historical Provider, MD  clotrimazole-betamethasone (LOTRISONE) cream Apply to affected area 2 times daily prn 01/15/14   Mellody Drown, PA-C  [facility] ANTI-FUNGAL 2 % powder Apply topically as needed.  04/02/14   Historical Provider, MD  DiphenhydrAMINE HCl 50 MG/30ML LIQD Take 25 mg by mouth at bedtime as needed (for sleep).    Historical Provider, MD  Febuxostat (ULORIC) 80 MG TABS Take 1 tablet by mouth daily.    Historical Provider, MD  insulin glargine (LANTUS) 100 UNIT/ML injection Inject 0.04 mLs (4 Units total) into the skin at bedtime. 04/20/14   Albertine Grates, MD  isosorbide mononitrate (IMDUR) 60 MG 24 hr tablet TAKE 1 TABLET (60 MG TOTAL) BY MOUTH DAILY. 05/06/14   Lyn Records, MD  KLOR-CON 10 10 MEQ tablet  04/28/14   Historical Provider, MD  latanoprost (XALATAN) 0.005 % ophthalmic solution Place 1 drop into both eyes at bedtime.    Historical Provider, MD  levothyroxine (SYNTHROID) 50 MCG tablet Take 1 tablet (50 mcg total) by mouth daily before breakfast. 04/20/14  Albertine GratesFang Xu, MD  metoprolol (LOPRESSOR) 12.5 mg TABS tablet Take 1 tablet (25 mg total) by mouth 2 (two) times daily. Patient taking differently: Take 12.5 mg by mouth 2 (two) times daily.  01/15/14   Mellody DrownLauren Parker, PA-C  senna (SENOKOT) 8.6 MG TABS tablet Take 2 tablets (17.2 mg total) by mouth at bedtime. 04/20/14   Albertine GratesFang Xu, MD  spironolactone (ALDACTONE) 25 MG tablet TAKE 1 TABLET (25 MG TOTAL) BY MOUTH DAILY. 05/31/14   Lyn RecordsHenry W Smith, MD  torsemide (DEMADEX) 20 MG tablet Take 1 tablet (20 mg total) by mouth daily. 01/20/14   Zannie CovePreetha [name], MD  warfarin (COUMADIN) 2.5 MG tablet Take 1.25-2.5 mg by mouth daily at 6 PM. TAKES 2.5MG  ON TUES ONLY TAKES 1.25MG  ALL OTHER DAYS     Historical Provider, MD  warfarin (COUMADIN) 2.5 MG tablet USE AS DIRECTED BY COUMADIN CLINIC 06/11/14   Lyn RecordsHenry W Smith, MD   BP 110/54 mmHg  Pulse 65  Temp(Src) 98 F (36.7 C) (Oral)  Resp 20  SpO2 97% Physical Exam  Constitutional: He appears well-developed and well-nourished.  HENT:  Head: Normocephalic and atraumatic.  Eyes: Conjunctivae and EOM are normal.  Neck: Normal range of motion. Neck supple.  Cardiovascular: Normal rate, regular rhythm and normal heart sounds.   Pulmonary/Chest: Effort normal and breath sounds normal. No respiratory distress.  Abdominal: He exhibits no distension. There is no tenderness. There is no rebound and no guarding.  Musculoskeletal: Normal range of motion.  Neurological: He is alert.  No focal deficits in context of limited exam 2/2 baseline mental status alteration  Skin: Skin is warm and dry.  Vitals reviewed.   ED Course  Procedures (including critical care time) Labs Review Labs Reviewed  CBC - Abnormal; Notable for the following:    RDW 16.2 (*)    All other components within normal limits  URINALYSIS, ROUTINE W REFLEX MICROSCOPIC (NOT AT [facility]) - Abnormal; Notable for the following:    Urobilinogen, UA 2.0 (*)    All other components within normal limits  PROTIME-INR - Abnormal; Notable for the following:    Prothrombin Time 22.9 (*)    INR 2.04 (*)    All other components within normal limits  COMPREHENSIVE METABOLIC PANEL - Abnormal; Notable for the following:    CO2 19 (*)    Glucose, Bld 169 (*)    BUN 39 (*)    Creatinine, Ser 2.82 (*)    Albumin 2.7 (*)    AST 56 (*)    Alkaline Phosphatase 129 (*)    GFR calc non Af Amer 20 (*)    GFR calc Af Amer 23 (*)    All other components within normal limits  Rosezena SensorI-STAT TROPOININ, ED    Imaging Review Dg Chest 2 View  08/02/2014   CLINICAL DATA:  Uncooperative.  Loss of consciousness.  EXAM: CHEST  2 VIEW  COMPARISON:  Radiograph 04/19/2014  FINDINGS: Sternotomy wires overlie  stable enlarged cardiac silhouette. Low lung volumes. Central venous congestion is similar prior. No overt pulmonary edema or pleural fluid.  IMPRESSION: Low lung volumes, cardiomegaly and central venous congestion.   Electronically Signed   By: Genevive BiStewart  Edmunds M.D.   On: 08/02/2014 19:03   Ct Head Wo Contrast  08/02/2014   CLINICAL DATA:  Syncope.  EXAM: CT HEAD WITHOUT CONTRAST  TECHNIQUE: Contiguous axial images were obtained from the base of the skull through the vertex without intravenous contrast.  COMPARISON:  07/29/2007  FINDINGS: There is  no acute intracranial hemorrhage or mass lesion. The patient has severe cerebral cortical atrophy, most prominent in the temporal and frontal lobes with secondary ventricular dilatation, progressed since the prior study of 2009. There is also increased periventricular white matter lucency with a small focal area of more localized white matter lucency high in the right parietal lobe. Old left cerebellar infarct.  Old lacunar infarct in the lower pons.  The no osseous abnormality.  IMPRESSION: No acute abnormality. Severe atrophy with extensive chronic small vessel ischemic changes.   Electronically Signed   By: Francene Boyers M.D.   On: 08/02/2014 19:16     EKG Interpretation   Date/Time:  Friday August 02 2014 18:11:26 EDT Ventricular Rate:  50 PR Interval:    QRS Duration: 126 QT Interval:  541 QTC Calculation: 493 R Axis:   16 Text Interpretation:  Junctional rhythm Left bundle branch block No  significant change since last tracing Confirmed by Mirian Mo [phone])  on 08/02/2014 6:18:23 PM      MDM   Final diagnoses:  Staring spell    80 y.o. male with pertinent PMH of CAD, sCHF, prior CVA, DM presents after having a staring spell while at dinner. Brief duration, patient did not lose postural tone, did not have a postictal period consistent with seizure. On arrival today vitals signs and physical exam as above, consistent with prior.  No focal  neuro deficits within context of limited exam.    Wu unremarkable.  Doubt TIA, ACS, or other emergent pathology given nature of symptoms.  Stable to dc home with PCP fu.    I have reviewed all laboratory and imaging studies if ordered as above  1. Staring spell         Mirian Mo, MD 08/02/14 [phone]

## 2014-08-03 NOTE — ED Notes (Signed)
[facility] was called to follow-up on transportation.

## 2014-08-03 NOTE — ED Notes (Signed)
[facility] here to transport pt back home.  ?

## 2014-08-04 ENCOUNTER — Other Ambulatory Visit: Payer: Self-pay | Admitting: Interventional Cardiology

## 2014-08-12 ENCOUNTER — Telehealth: Payer: Self-pay | Admitting: Interventional Cardiology

## 2014-08-12 NOTE — Telephone Encounter (Signed)
Patient's wife st that they have to cancel Dr. Michaelle Copas OV on Thursday because her husband is unable to leave the house.  Per patient's wife, he is unable to use [facility] as he is too weak. She is requesting Dr. Katrinka Blazing order regular Home Care for the patient since he is unable to leave the home and he is on Coumadin.  To Dr. Katrinka Blazing for recommendations.

## 2014-08-12 NOTE — Telephone Encounter (Signed)
We should arrange home health. Will need CHF RN visits and blood draw for coumadin. Ask family if we should reconsider Hospice? It would help provide these services indefinitely.

## 2014-08-12 NOTE — Telephone Encounter (Signed)
New Message     Pt wife is calling in stating that pt is too weak to come in and she would like Dr. Katrinka Blazing to have a Rn come out to the house and   Check him and his Coumadin. She states that he is too weak to take the [facility] bus.  A week a go she called EMS to help pt was in a [facility]   Please call wife to see if [facility] can come back

## 2014-08-13 NOTE — Telephone Encounter (Signed)
Spoke with pt wife >51min. Adv her of the benefits hospice care . Pt wife agreeable to proceed with hospice ref. Contacted [facility]. rqst info faxed to their office.

## 2014-08-15 ENCOUNTER — Ambulatory Visit: Payer: Medicare Other | Admitting: Interventional Cardiology

## 2014-08-19 ENCOUNTER — Telehealth: Payer: Self-pay | Admitting: Interventional Cardiology

## 2014-08-19 NOTE — Telephone Encounter (Signed)
New Message  Admitted pt today to [facility]- per pt's family- pt may need PT/INR. Rn from [facility] wanted to know if they should draw it this week. Please call back and discuss.

## 2014-08-19 NOTE — Telephone Encounter (Signed)
Returned d call to [employer] @ [employer]. Adv her that pt is past due for his INR ck. She should draw opt INR when she is back out to the pt home, adv her reading should be called in to the coumadin clinic for instructions on dosing pt coumadin

## 2014-08-22 LAB — PROTIME-INR: INR: 2.1 — AB (ref <=1.1)

## 2014-08-23 ENCOUNTER — Ambulatory Visit (INDEPENDENT_AMBULATORY_CARE_PROVIDER_SITE_OTHER): Payer: Medicare Other | Admitting: Cardiovascular Disease

## 2014-08-23 DIAGNOSIS — I639 Cerebral infarction, unspecified: Secondary | ICD-10-CM

## 2014-08-23 DIAGNOSIS — I6350 Cerebral infarction due to unspecified occlusion or stenosis of unspecified cerebral artery: Secondary | ICD-10-CM

## 2014-09-01 ENCOUNTER — Other Ambulatory Visit: Payer: Self-pay | Admitting: Interventional Cardiology

## 2014-09-12 ENCOUNTER — Telehealth: Payer: Self-pay | Admitting: Interventional Cardiology

## 2014-09-12 ENCOUNTER — Ambulatory Visit: Payer: Self-pay | Admitting: Interventional Cardiology

## 2014-09-12 DIAGNOSIS — I6350 Cerebral infarction due to unspecified occlusion or stenosis of unspecified cerebral artery: Secondary | ICD-10-CM

## 2014-09-12 NOTE — Telephone Encounter (Signed)
Returned call to [facility]. Message of pt passing noted. Adv her that Dr.Smith is out of the office. We will fwd him the message and pay our condolences to the family

## 2014-09-12 NOTE — Telephone Encounter (Signed)
New message     Calling to inform office that pt passed away this morning. Pt was pronounced at 8:51 a.m. Please call to discuss

## 2014-09-17 ENCOUNTER — Telehealth: Payer: Self-pay | Admitting: Interventional Cardiology

## 2014-09-17 NOTE — Telephone Encounter (Signed)
[facility] d[facility] today [facility]

## 2014-09-18 ENCOUNTER — Telehealth: Payer: Self-pay | Admitting: Interventional Cardiology

## 2014-09-18 NOTE — Telephone Encounter (Signed)
Amy called me back from Dr.[name] [facility] office she has agreed to sign the d/c I [facility] spoke with Lars MageJuan and Delice Bisonara provided them with the phone and address [facility] they stated the d/c I have they will come pick back up.

## 2014-09-18 NOTE — Telephone Encounter (Signed)
Dr.Smith is out of office called and spoke with Kriste [facility]Becky at [name] she is going to send a staff message around to see if she will sign d/c  She is to call me back and let me know.

## 2014-09-30 ENCOUNTER — Other Ambulatory Visit: Payer: Self-pay | Admitting: Interventional Cardiology

## 2014-09-30 DEATH — deceased

## 2014-10-01 ENCOUNTER — Other Ambulatory Visit: Payer: Self-pay

## 2014-10-01 NOTE — Telephone Encounter (Signed)
Patient is deceased.

## 2014-10-04 NOTE — Telephone Encounter (Signed)
[facility] called and notified of patient being deceased.

## 2015-09-24 IMAGING — CR DG CHEST 1V PORT
1 series · 1 of 1 positions shown · non-contrast
Comparison: 07/02/2012

CLINICAL DATA: Shortness of breath, chest pain and palpitations.

EXAM:
PORTABLE CHEST - 1 VIEW

[AP]
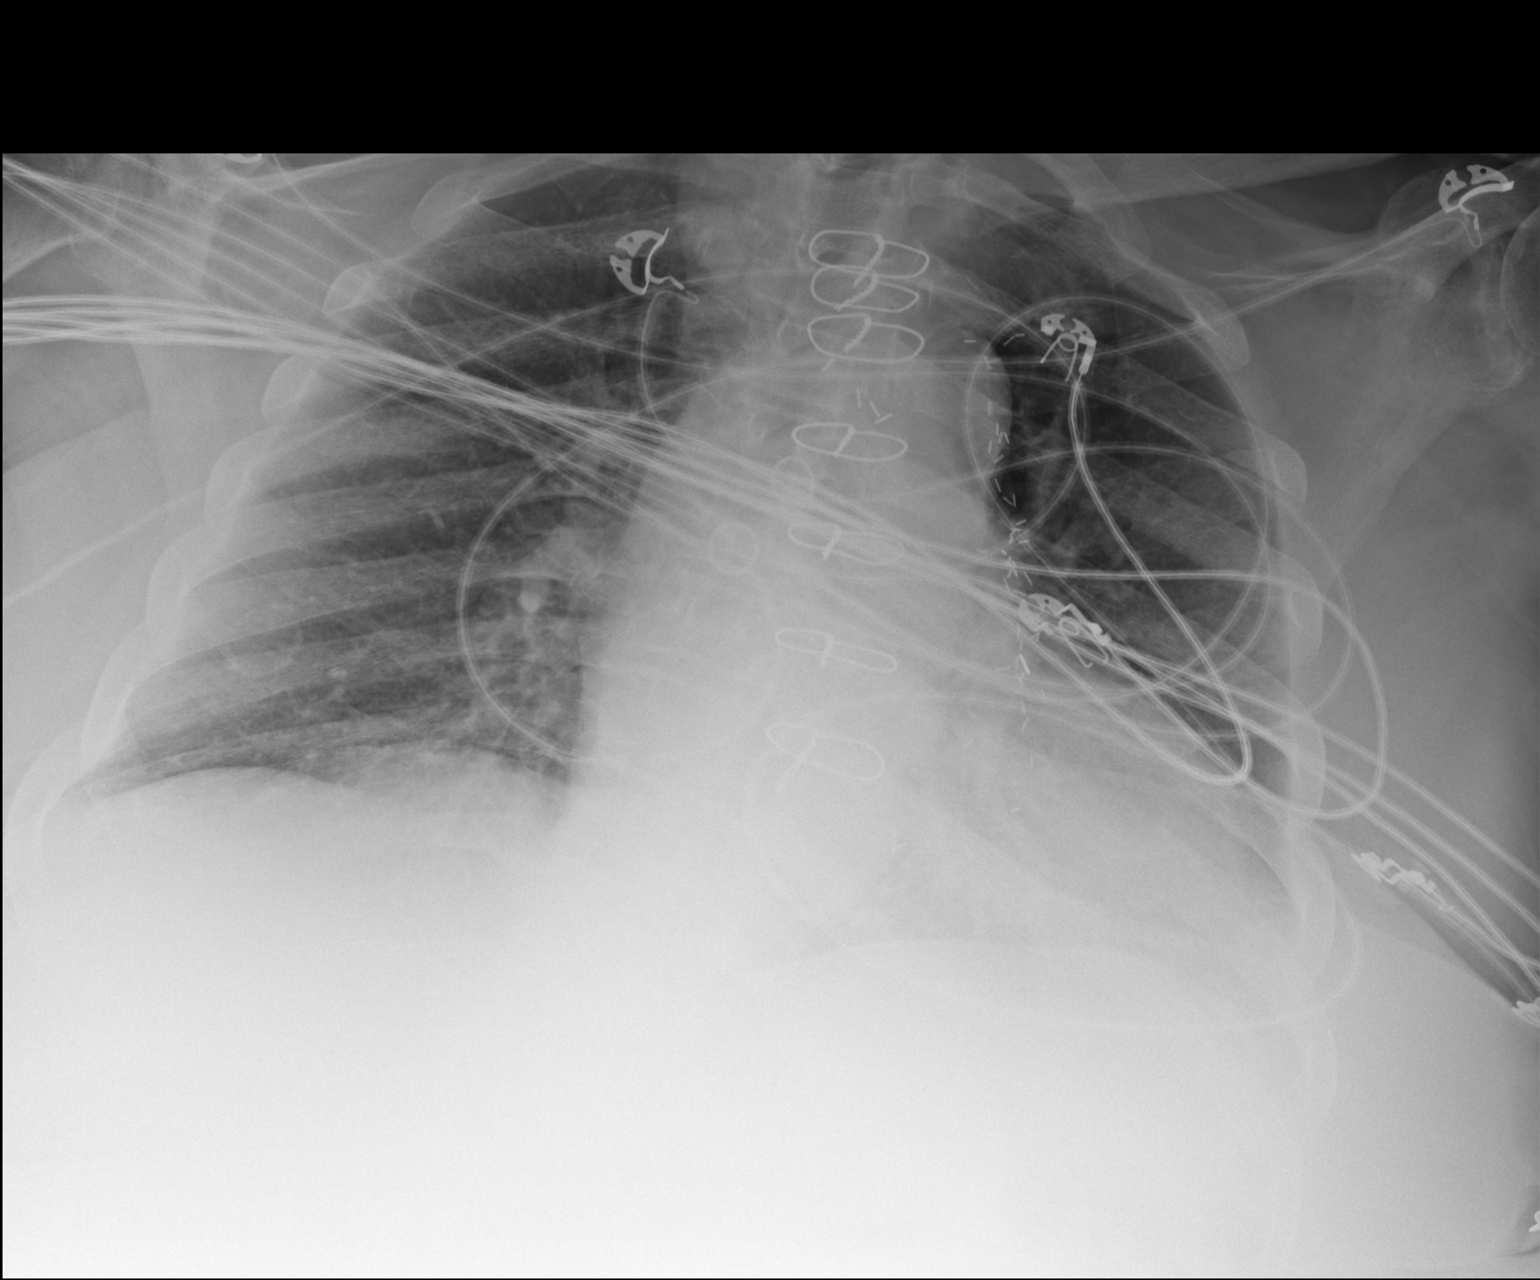

[1 of 1 positions shown; findings below may reference images not displayed]

FINDINGS: The heart size is stable and mildly enlarged status post prior CABG.
Lung volumes are low with bibasilar atelectasis present. There is no
evidence of pulmonary edema, consolidation, pneumothorax, nodule or
pleural fluid.
IMPRESSION: Stable cardiomegaly. Low volumes with bibasilar atelectasis. No
active disease.

## 2015-09-24 IMAGING — CR DG ABDOMEN 2V
2 series · 2 of 2 positions shown · non-contrast
Comparison: None.

CLINICAL DATA: Chest pain and dyspnea for 2-3 days

EXAM:
ABDOMEN - 2 VIEW

[x abdomen supine]
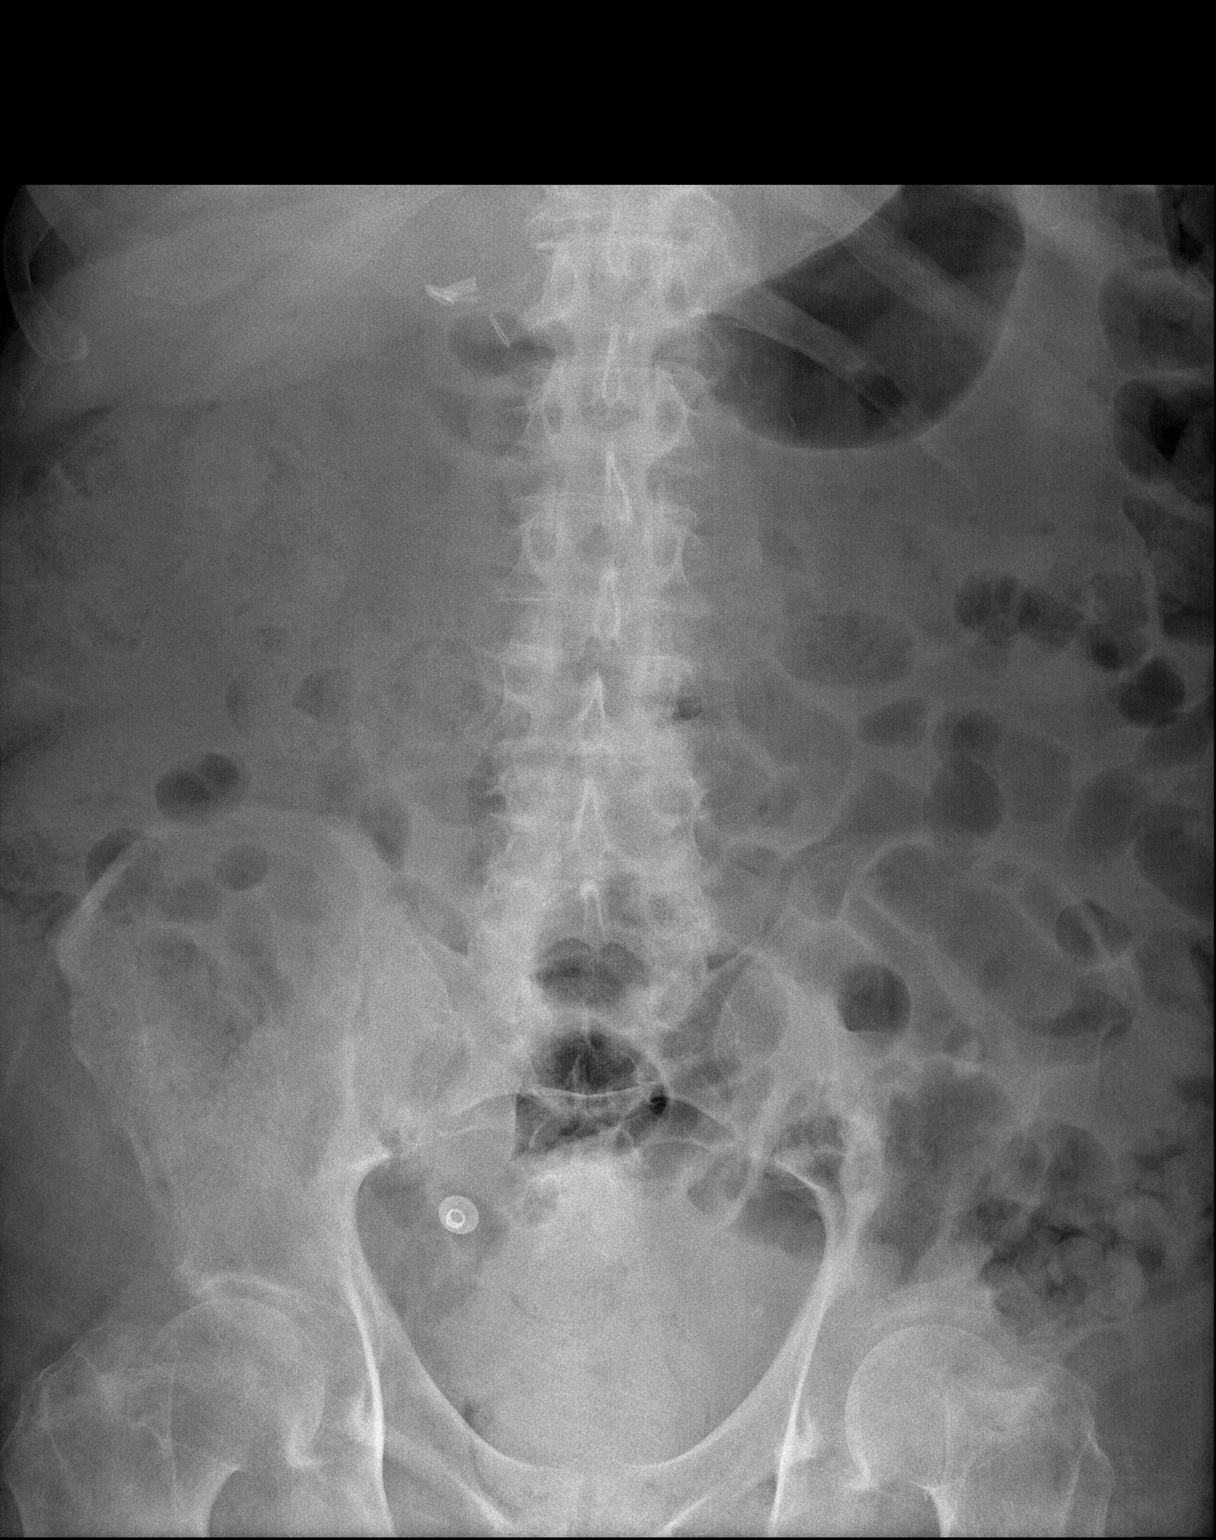

[w abdomen decub]
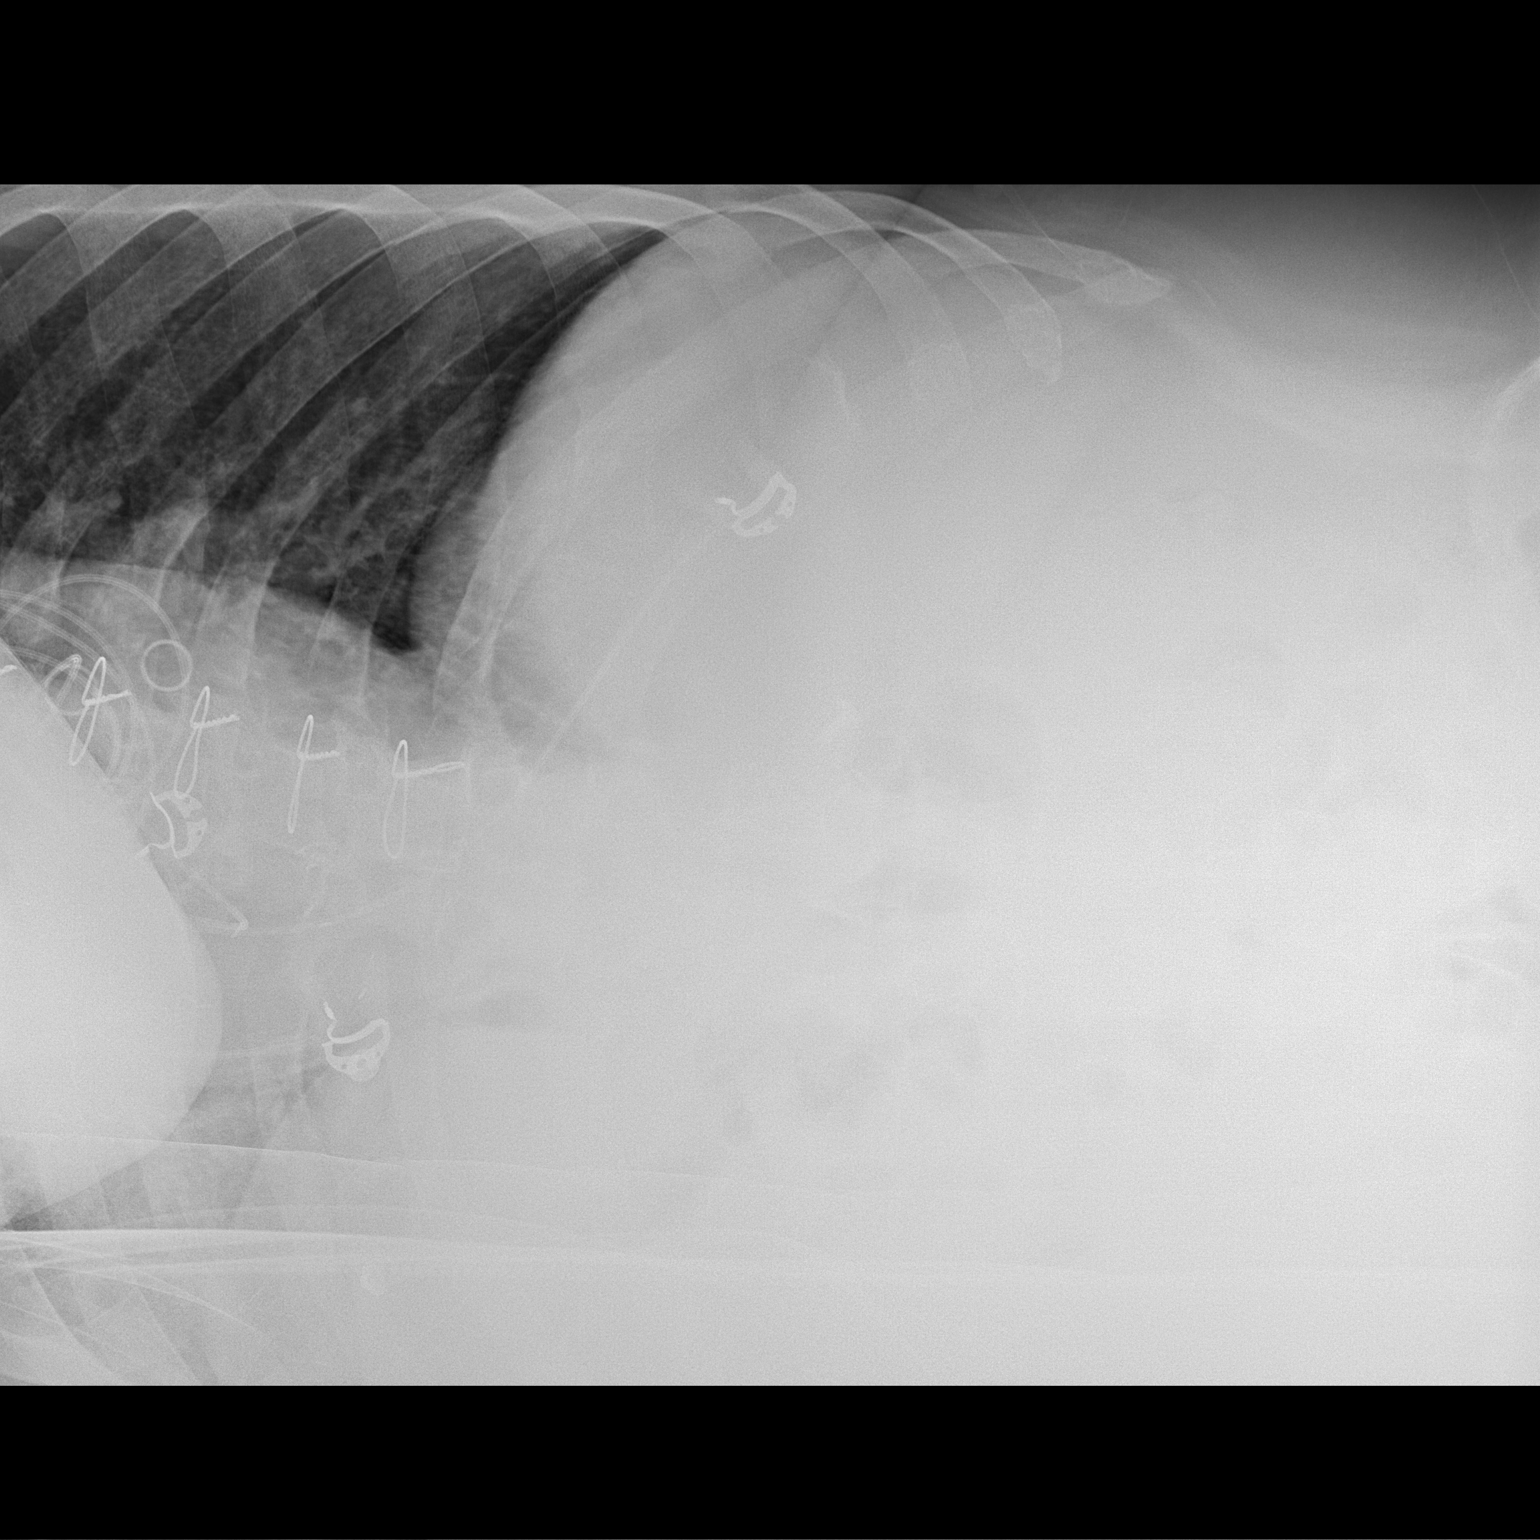

[2 of 2 positions shown; findings below may reference images not displayed]

FINDINGS: The bowel gas pattern is normal. There is no evidence of free air.
No radio-opaque calculi or other significant radiographic
abnormality is seen.
IMPRESSION: Negative.

## 2016-01-07 IMAGING — CT CT HEAD W/O CM
3 of 4 series · 17 of 30 positions shown, 19 images · non-contrast
Comparison: 07/29/2007

CLINICAL DATA: Syncope.

EXAM:
CT HEAD WITHOUT CONTRAST
TECHNIQUE: Contiguous axial images were obtained from the base of the skull
through the vertex without intravenous contrast.

[Series 3: bone windows · axial · 0.42mm/px · z∈[+1373,+1403]mm · 3 of 25 slices shown (1 of 2)]
[im 5/25  bone]
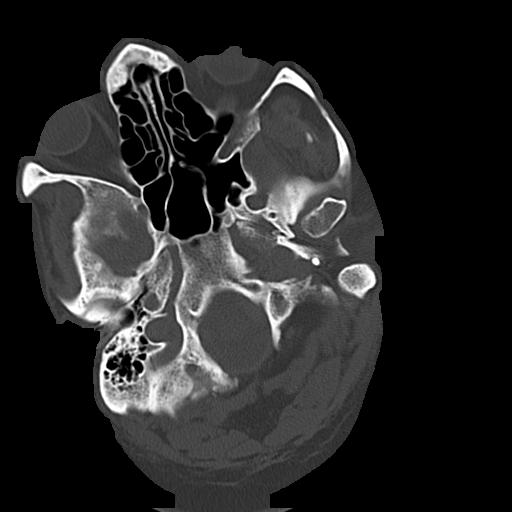
[im 10/25  bone]
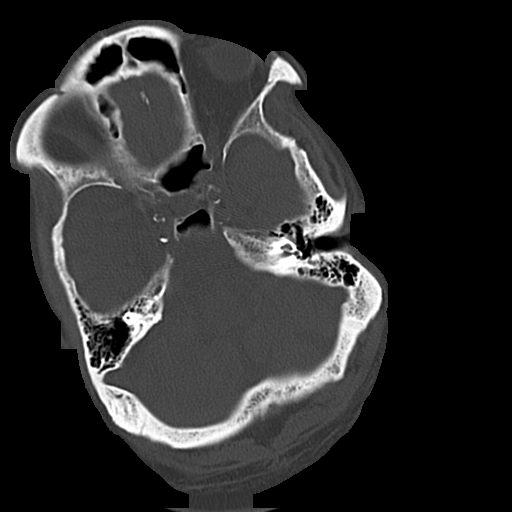
[im 15/25  bone]
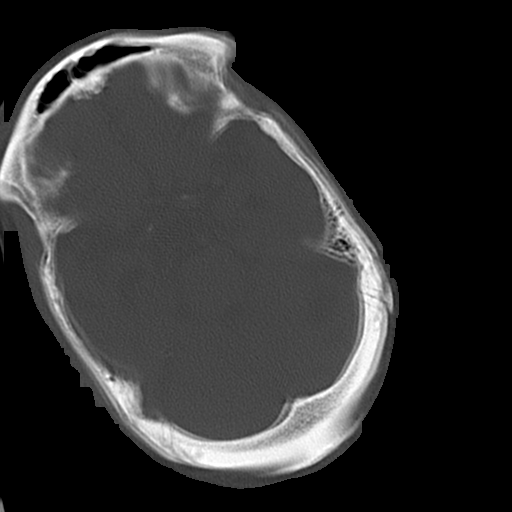

[Series 4: head w/o · axial · non-contrast · 0.42mm/px · z∈[+1381,+1491]mm · 6 of 32 slices shown, 8 images]
[im 5/32  brain]
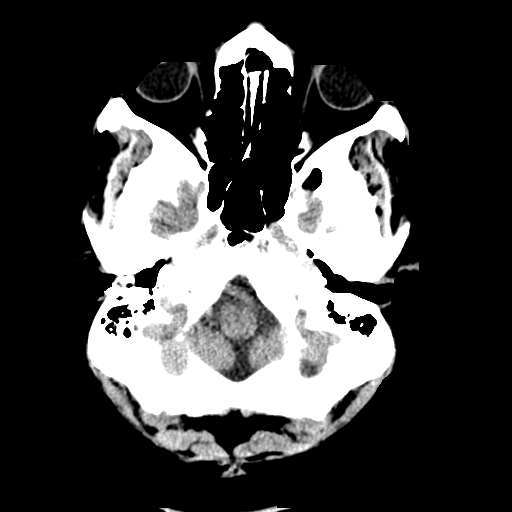
[im 5/32  bone]
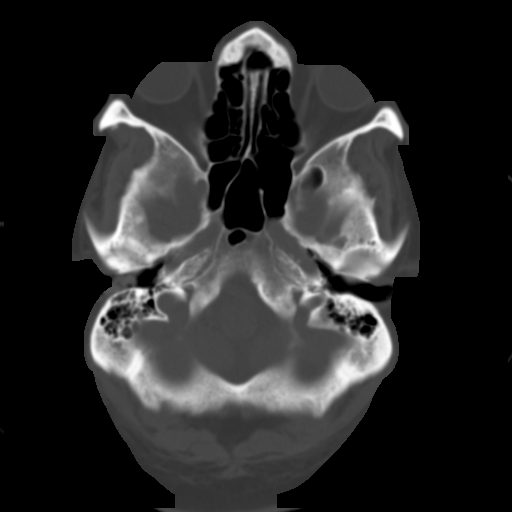
[im 9/32  brain]
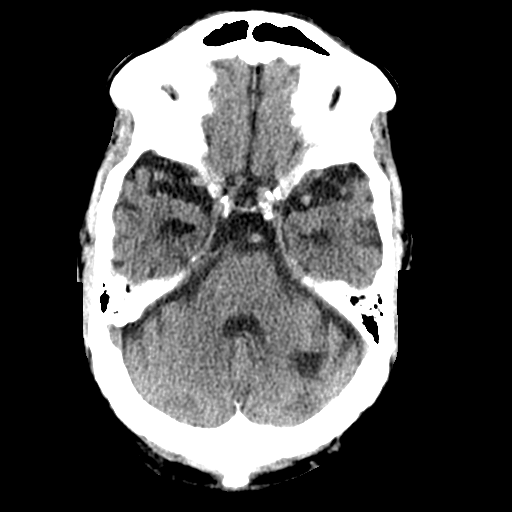
[im 14/32  brain]
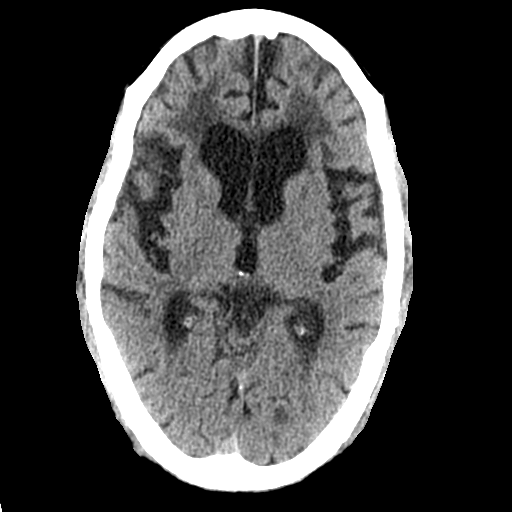
[im 18/32  brain]
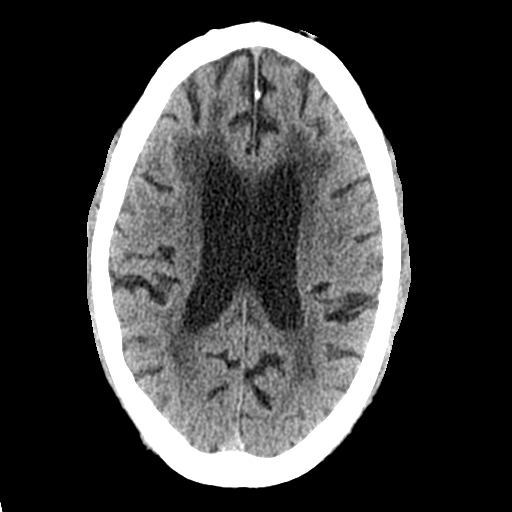
[im 23/32  brain]
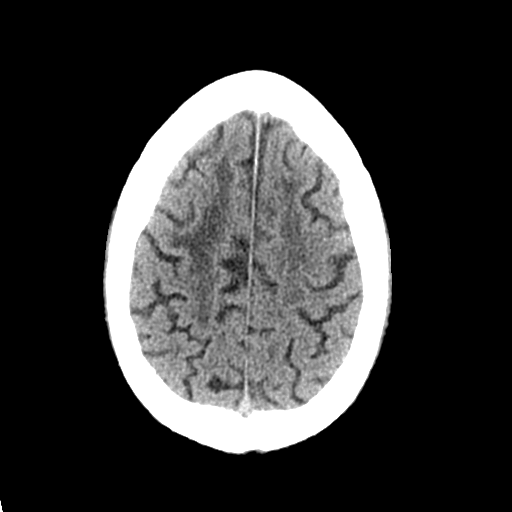
[im 23/32  bone]
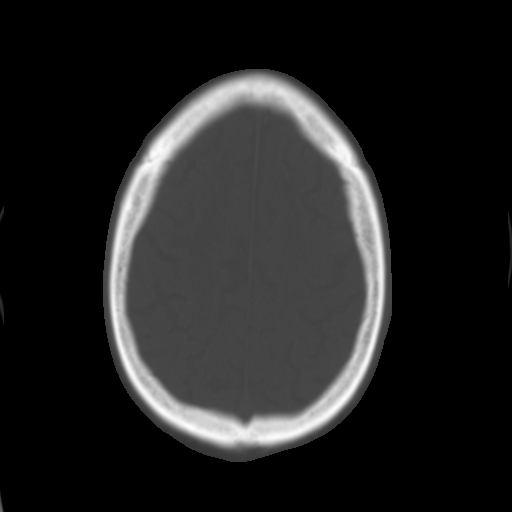
[im 27/32  brain]
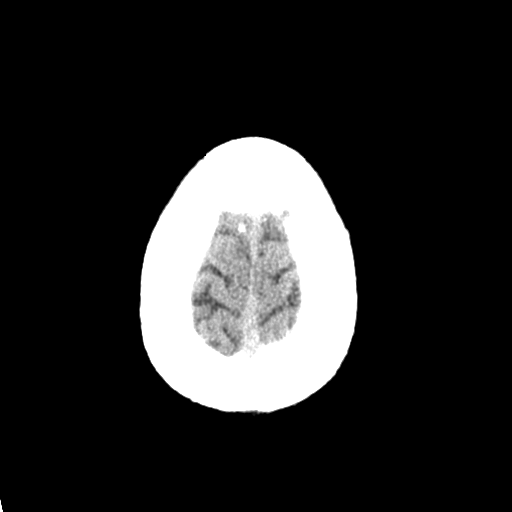

[Series 5: bone windows · axial · 0.42mm/px · z∈[+1373,+1502]mm · 8 of 53 slices shown (2 of 2)]
[im 5/53  bone]
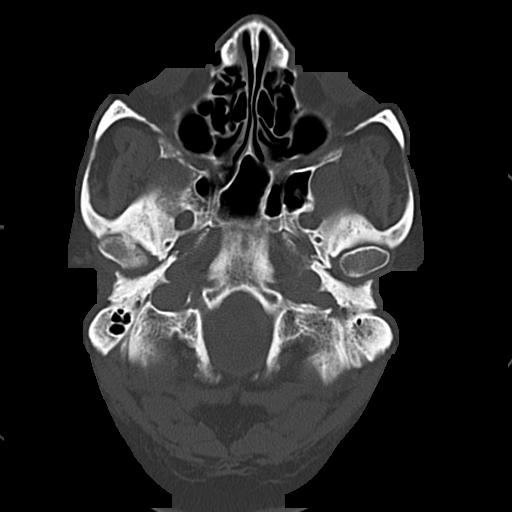
[im 14/53  bone]
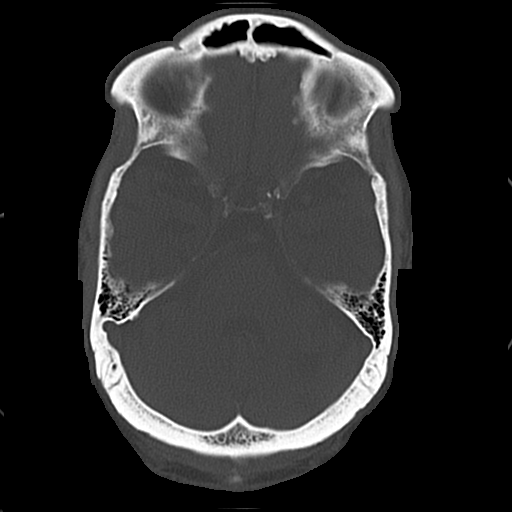
[im 18/53  bone]
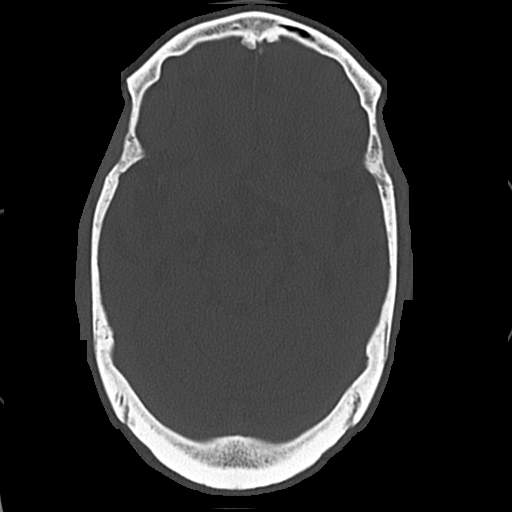
[im 22/53  bone]
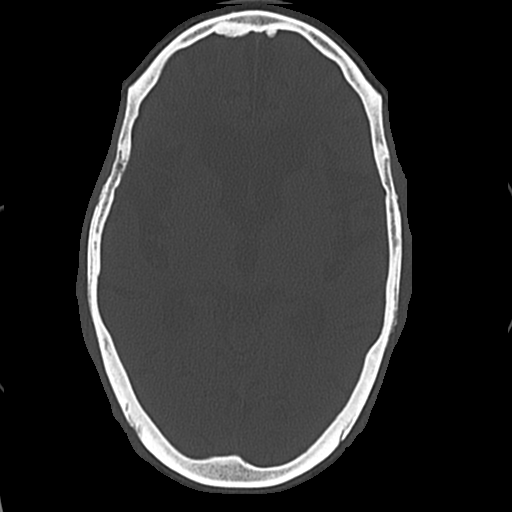
[im 31/53  bone]
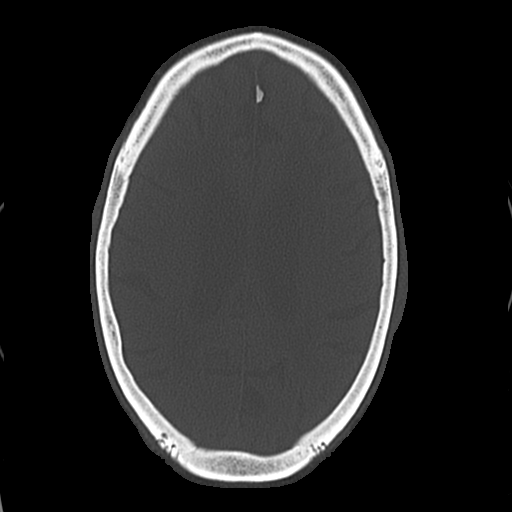
[im 35/53  bone]
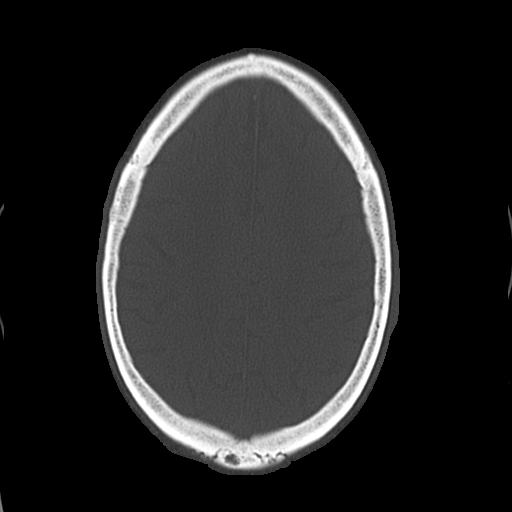
[im 40/53  bone]
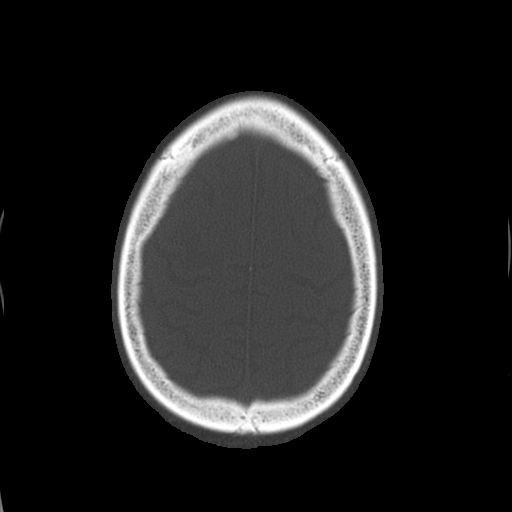
[im 48/53  bone]
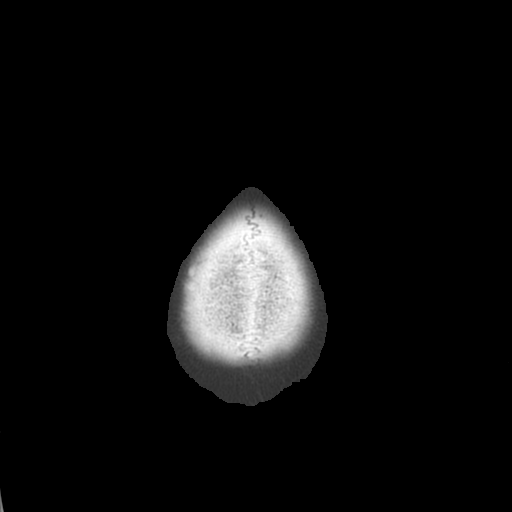

[17 of 30 positions shown; findings below may reference images not displayed]

FINDINGS: There is no acute intracranial hemorrhage or mass lesion. The
patient has severe cerebral cortical atrophy, most prominent in the
temporal and frontal lobes with secondary ventricular dilatation,
progressed since the prior study of 8116. There is also increased
periventricular white matter lucency with a small focal area of more
localized white matter lucency high in the right parietal lobe. Old
left cerebellar infarct.

Old lacunar infarct in the lower pons.  The no osseous abnormality.
IMPRESSION: No acute abnormality. Severe atrophy with extensive chronic small
vessel ischemic changes.
